# Patient Record
Sex: Male | Born: 1960 | Race: White | Hispanic: No | Marital: Married | Smoking: Never smoker
Health system: Southern US, Community
[De-identification: ages and names within clinical notes are randomized; demographics above are authoritative.]

## PROBLEM LIST (undated history)

## (undated) DIAGNOSIS — E291 Testicular hypofunction: Secondary | ICD-10-CM

## (undated) DIAGNOSIS — M1990 Unspecified osteoarthritis, unspecified site: Secondary | ICD-10-CM

## (undated) DIAGNOSIS — F419 Anxiety disorder, unspecified: Secondary | ICD-10-CM

## (undated) DIAGNOSIS — J302 Other seasonal allergic rhinitis: Secondary | ICD-10-CM

## (undated) DIAGNOSIS — E785 Hyperlipidemia, unspecified: Secondary | ICD-10-CM

## (undated) DIAGNOSIS — E2740 Unspecified adrenocortical insufficiency: Secondary | ICD-10-CM

## (undated) DIAGNOSIS — D582 Other hemoglobinopathies: Secondary | ICD-10-CM

## (undated) DIAGNOSIS — N529 Male erectile dysfunction, unspecified: Secondary | ICD-10-CM

## (undated) DIAGNOSIS — I10 Essential (primary) hypertension: Secondary | ICD-10-CM

## (undated) DIAGNOSIS — F988 Other specified behavioral and emotional disorders with onset usually occurring in childhood and adolescence: Secondary | ICD-10-CM

## (undated) DIAGNOSIS — B1910 Unspecified viral hepatitis B without hepatic coma: Secondary | ICD-10-CM

## (undated) HISTORY — PX: KNEE ARTHROSCOPY W/ MENISCAL REPAIR: SHX1877

---
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
## 2013-08-20 DIAGNOSIS — E785 Hyperlipidemia, unspecified: Secondary | ICD-10-CM

## 2013-08-20 DIAGNOSIS — N529 Male erectile dysfunction, unspecified: Secondary | ICD-10-CM

## 2013-08-20 DIAGNOSIS — F988 Other specified behavioral and emotional disorders with onset usually occurring in childhood and adolescence: Secondary | ICD-10-CM

## 2013-08-20 DIAGNOSIS — E291 Testicular hypofunction: Secondary | ICD-10-CM

## 2013-08-20 DIAGNOSIS — E2740 Unspecified adrenocortical insufficiency: Secondary | ICD-10-CM

## 2013-08-20 HISTORY — DX: Unspecified adrenocortical insufficiency: E27.40

## 2013-08-20 HISTORY — DX: Male erectile dysfunction, unspecified: N52.9

## 2013-08-20 HISTORY — DX: Testicular hypofunction: E29.1

## 2013-08-20 HISTORY — DX: Hyperlipidemia, unspecified: E78.5

## 2013-08-20 HISTORY — DX: Other specified behavioral and emotional disorders with onset usually occurring in childhood and adolescence: F98.8

## 2014-01-25 ENCOUNTER — Encounter (HOSPITAL_COMMUNITY)
Admission: RE | Admit: 2014-01-25 | Discharge: 2014-01-25 | Disposition: A | Discharge status: Home / Self Care | Payer: Self-pay | Source: Ambulatory Visit | Attending: *Deleted | Admitting: *Deleted

## 2014-01-25 ENCOUNTER — Encounter (HOSPITAL_COMMUNITY): Payer: Self-pay

## 2014-01-29 ENCOUNTER — Encounter (HOSPITAL_COMMUNITY): Payer: BC Managed Care – PPO

## 2014-01-29 ENCOUNTER — Encounter (HOSPITAL_COMMUNITY): Admission: RE | Admit: 2014-01-29 | Payer: BC Managed Care – PPO | Source: Ambulatory Visit

## 2014-02-01 ENCOUNTER — Encounter (HOSPITAL_COMMUNITY): Admission: RE | Admit: 2014-02-01 | Payer: BC Managed Care – PPO | Source: Ambulatory Visit

## 2014-02-03 ENCOUNTER — Encounter (HOSPITAL_COMMUNITY)
Admission: RE | Admit: 2014-02-03 | Discharge: 2014-02-03 | Disposition: A | Discharge status: Home / Self Care | Payer: BC Managed Care – PPO | Source: Ambulatory Visit | Attending: *Deleted | Admitting: *Deleted

## 2014-02-03 ENCOUNTER — Encounter (HOSPITAL_COMMUNITY): Payer: Self-pay

## 2014-02-03 DIAGNOSIS — D45 Polycythemia vera: Secondary | ICD-10-CM | POA: Insufficient documentation

## 2014-02-03 HISTORY — DX: Hyperlipidemia, unspecified: E78.5

## 2014-02-03 HISTORY — DX: Unspecified adrenocortical insufficiency: E27.40

## 2014-02-03 HISTORY — DX: Testicular hypofunction: E29.1

## 2014-02-03 HISTORY — DX: Other specified behavioral and emotional disorders with onset usually occurring in childhood and adolescence: F98.8

## 2014-02-03 HISTORY — DX: Male erectile dysfunction, unspecified: N52.9

## 2014-02-03 LAB — CBC
HCT: 55.9 % — ABNORMAL HIGH (ref 39.0–52.0)
Hemoglobin: 19.9 g/dL — ABNORMAL HIGH (ref 13.0–17.0)
MCH: 34 pg (ref 26.0–34.0)
MCHC: 35.6 g/dL (ref 30.0–36.0)
MCV: 95.6 fL (ref 78.0–100.0)
Platelets: 196 10*3/uL (ref 150–400)
RBC: 5.85 MIL/uL — ABNORMAL HIGH (ref 4.22–5.81)
RDW: 12.8 % (ref 11.5–15.5)
WBC: 8 10*3/uL (ref 4.0–10.5)

## 2014-02-03 NOTE — Discharge Instructions (Signed)
Polycythemia Vera  Polycythemia Jeffrey Chapman is a condition in which the body makes too many red blood cells and there is no known cause. The red blood cells (erythrocytes) are the cells which carry the oxygen in your blood stream to the cells of your body. Because of the increased red blood cells, the blood becomes thicker and does not circulate as well. It would be similar to your car having oil which is too thick so it cannot start and circulate as well. When the blood is too thick it often causes headaches and dizziness. It may also cause blood clots. Even though the blood clots easier, these patients bleed easier. The bleeding is caused because the blood cells which help stop bleeding (platelets) do not function normally. It occurs in all age groups but is more common in the 72 to 63 year age range. TREATMENT  The treatment of polycythemia vera for many years has been blood removal (phlebotomy) which is similar to blood removal in a blood bank, however this blood is not used for donation. Hydroxyurea is used to supplement phlebotomy. Aspirin is commonly given to thin the blood as long as the patient does not have a problem with bleeding. Other drugs are used based on the progression of the disease. Document Released: 05/01/2001 Document Revised: 10/29/2011 Document Reviewed: 11/05/2008 [facility] Patient Information 2015 [place], [place]. This information is not intended to replace advice given to you by your health care provider. Make sure you discuss any questions you have with your health care provider.  Therapeutic Phlebotomy Therapeutic phlebotomy is the controlled removal of blood from your body for the purpose of treating a medical condition. It is similar to donating blood. Usually, about a pint (470 mL) of blood is removed. The average adult has 9 to 12 pints (4.3 to 5.7 L) of blood. Therapeutic phlebotomy may be used to treat the following medical conditions:  Hemochromatosis. This is a condition in  which there is too much iron in the blood.  Polycythemia vera. This is a condition in which there are too many red cells in the blood.  Porphyria cutanea tarda. This is a disease usually passed from one generation to the next (inherited). It is a condition in which an important part of hemoglobin is not made properly. This results in the build up of abnormal amounts of porphyrins in the body.  Sickle cell disease. This is an inherited disease. It is a condition in which the red blood cells form an abnormal crescent shape rather than a round shape. LET YOUR CAREGIVER KNOW ABOUT:  Allergies.  Medicines taken including herbs, eyedrops, over-the-counter medicines, and creams.  Use of steroids (by mouth or creams).  Previous problems with anesthetics or numbing medicine.  History of blood clots.  History of bleeding or blood problems.  Previous surgery.  Possibility of pregnancy, if this applies. RISKS AND COMPLICATIONS This is a simple and safe procedure. Problems are unlikely. However, problems can occur and may include:  Nausea or lightheadedness.  Low blood pressure.  Soreness, bleeding, swelling, or bruising at the needle insertion site.  Infection. BEFORE THE PROCEDURE  This is a procedure that can be done as an outpatient. Confirm the time that you need to arrive for your procedure. Confirm whether there is a need to fast or withhold any medications. It is helpful to wear clothing with sleeves that can be raised above the elbow. A blood sample may be done to determine the amount of red blood cells or iron in  your blood. Plan ahead of time to have someone drive you home after the procedure. PROCEDURE The entire procedure from preparation through recovery takes about 1 hour. The actual collection takes about 10 to 15 minutes.  A needle will be inserted into your vein.  Tubing and a collection bag will be attached to that needle.  Blood will flow through the needle and  tubing into the collection bag.  You may be asked to open and close your hand slowly and continuously during the entire collection.  Once the specified amount of blood has been removed from your body, the collection bag and tubing will be clamped.  The needle will be removed.  Pressure will be held on the site of the needle insertion to stop the bleeding. Then a bandage will be placed over the needle insertion site. AFTER THE PROCEDURE  Your recovery will be assessed and monitored. If there are no problems, as an outpatient, you should be able to go home shortly after the procedure.  Document Released: 01/08/2011 Document Revised: 10/29/2011 Document Reviewed: 01/08/2011 [facility] Patient Information 2014 [place], [place].

## 2014-02-03 NOTE — Progress Notes (Signed)
Marinus Maw. presents today for phlebotomy per MD orders. HGB/HCTResults for DEANDREA, RION (MRN [id number]) as of 02/03/2014 10:39  Ref. Range 02/03/2014 09:30  WBC Latest Range: 4.0-10.5 K/uL 8.0  RBC Latest Range: 4.22-5.81 MIL/uL 5.85 (H)  Hemoglobin Latest Range: 13.0-17.0 g/dL 19.9 (H)  HCT Latest Range: 39.0-52.0 % 55.9 (H)  MCV Latest Range: 78.0-100.0 fL 95.6  MCH Latest Range: 26.0-34.0 pg 34.0  MCHC Latest Range: 30.0-36.0 g/dL 35.6  RDW Latest Range: 11.5-15.5 % 12.8  Platelets Latest Range: 150-400 K/uL 196   Phlebotomy procedure started at 1001 and ended at 1013. One unit removed. Patient tolerated procedure well. IV needle removed intact.  BP pre procedure 184/110;HR84: resp 18 Sat 97% Post procedure 162/111; HR 81 Sar 98% Will return next week for post H/H

## 2014-02-10 ENCOUNTER — Encounter (HOSPITAL_COMMUNITY)
Admission: RE | Admit: 2014-02-10 | Discharge: 2014-02-10 | Disposition: A | Discharge status: Home / Self Care | Payer: BC Managed Care – PPO | Source: Ambulatory Visit | Attending: *Deleted | Admitting: *Deleted

## 2014-02-10 ENCOUNTER — Encounter (HOSPITAL_COMMUNITY): Payer: Self-pay

## 2014-02-10 LAB — CBC
HCT: 52.5 % — ABNORMAL HIGH (ref 39.0–52.0)
Hemoglobin: 18.5 g/dL — ABNORMAL HIGH (ref 13.0–17.0)
MCH: 33.6 pg (ref 26.0–34.0)
MCHC: 35.2 g/dL (ref 30.0–36.0)
MCV: 95.5 fL (ref 78.0–100.0)
PLATELETS: 183 10*3/uL (ref 150–400)
RBC: 5.5 MIL/uL (ref 4.22–5.81)
RDW: 12.8 % (ref 11.5–15.5)
WBC: 7.2 10*3/uL (ref 4.0–10.5)

## 2014-02-10 NOTE — Progress Notes (Signed)
Results for Jeffrey Chapman, Jeffrey Chapman (MRN [id number]) as of 02/10/2014 10:25  1 week post therapeutic phebotomy  Lab work.   Ref. Range 02/10/2014 09:32  WBC Latest Range: 4.0-10.5 K/uL 7.2  RBC Latest Range: 4.22-5.81 MIL/uL 5.50  Hemoglobin Latest Range: 13.0-17.0 g/dL 18.5 (H)  HCT Latest Range: 39.0-52.0 % 52.5 (H)  MCV Latest Range: 78.0-100.0 fL 95.5  MCH Latest Range: 26.0-34.0 pg 33.6  MCHC Latest Range: 30.0-36.0 g/dL 35.2  RDW Latest Range: 11.5-15.5 % 12.8  Platelets Latest Range: 150-400 K/uL 183

## 2014-02-15 ENCOUNTER — Encounter (HOSPITAL_COMMUNITY): Payer: BC Managed Care – PPO

## 2014-02-16 ENCOUNTER — Encounter (HOSPITAL_COMMUNITY)
Admission: RE | Admit: 2014-02-16 | Discharge: 2014-02-16 | Disposition: A | Discharge status: Home / Self Care | Payer: BC Managed Care – PPO | Source: Ambulatory Visit | Attending: *Deleted | Admitting: *Deleted

## 2014-02-16 LAB — CBC WITH DIFFERENTIAL/PLATELET
BASOS ABS: 0 10*3/uL (ref 0.0–0.1)
Basophils Relative: 1 % (ref 0–1)
Eosinophils Absolute: 0.4 10*3/uL (ref 0.0–0.7)
Eosinophils Relative: 6 % — ABNORMAL HIGH (ref 0–5)
HCT: 51.5 % (ref 39.0–52.0)
Hemoglobin: 18.3 g/dL — ABNORMAL HIGH (ref 13.0–17.0)
Lymphocytes Relative: 31 % (ref 12–46)
Lymphs Abs: 2.3 10*3/uL (ref 0.7–4.0)
MCH: 33.8 pg (ref 26.0–34.0)
MCHC: 35.5 g/dL (ref 30.0–36.0)
MCV: 95.2 fL (ref 78.0–100.0)
Monocytes Absolute: 0.8 10*3/uL (ref 0.1–1.0)
Monocytes Relative: 11 % (ref 3–12)
NEUTROS ABS: 3.9 10*3/uL (ref 1.7–7.7)
Neutrophils Relative %: 51 % (ref 43–77)
Platelets: 215 10*3/uL (ref 150–400)
RBC: 5.41 MIL/uL (ref 4.22–5.81)
RDW: 13 % (ref 11.5–15.5)
WBC: 7.5 10*3/uL (ref 4.0–10.5)

## 2014-02-16 NOTE — Progress Notes (Signed)
Results for Jeffrey Chapman, Jeffrey Chapman (MRN [id number]) as of 02/16/2014 13:48  Ref. Range 02/16/2014 08:40  WBC Latest Range: 4.0-10.5 K/uL 7.5  RBC Latest Range: 4.22-5.81 MIL/uL 5.41  Hemoglobin Latest Range: 13.0-17.0 g/dL 18.3 (H)  HCT Latest Range: 39.0-52.0 % 51.5  MCV Latest Range: 78.0-100.0 fL 95.2  MCH Latest Range: 26.0-34.0 pg 33.8  MCHC Latest Range: 30.0-36.0 g/dL 35.5  RDW Latest Range: 11.5-15.5 % 13.0  Platelets Latest Range: 150-400 K/uL 215  Neutrophils Relative % Latest Range: 43-77 % 51  Lymphocytes Relative Latest Range: 12-46 % 31  Monocytes Relative Latest Range: 3-12 % 11  Eosinophils Relative Latest Range: 0-5 % 6 (H)  Basophils Relative Latest Range: 0-1 % 1  NEUT# Latest Range: 1.7-7.7 K/uL 3.9  Lymphocytes Absolute Latest Range: 0.7-4.0 K/uL 2.3  Monocytes Absolute Latest Range: 0.1-1.0 K/uL 0.8  Eosinophils Absolute Latest Range: 0.0-0.7 K/uL 0.4  Basophils Absolute Latest Range: 0.0-0.1 K/uL 0.0

## 2014-02-16 NOTE — Progress Notes (Signed)
Jeffrey Chapman. presents today for phlebotomy per MD orders. HGB/HCT:18.3/51.5 Phlebotomy procedure started at 0917 and ended at 0925. 1 unit removed. Patient tolerated procedure well. IV needle removed intact. Will return for follow up CBC in 1 week.

## 2014-02-23 ENCOUNTER — Encounter (HOSPITAL_COMMUNITY)
Admission: RE | Admit: 2014-02-23 | Discharge: 2014-02-23 | Disposition: A | Discharge status: Home / Self Care | Payer: BC Managed Care – PPO | Source: Ambulatory Visit | Attending: *Deleted | Admitting: *Deleted

## 2014-02-23 DIAGNOSIS — D45 Polycythemia vera: Secondary | ICD-10-CM | POA: Insufficient documentation

## 2014-02-23 LAB — CBC
HCT: 51 % (ref 39.0–52.0)
Hemoglobin: 18 g/dL — ABNORMAL HIGH (ref 13.0–17.0)
MCH: 33.5 pg (ref 26.0–34.0)
MCHC: 35.3 g/dL (ref 30.0–36.0)
MCV: 95 fL (ref 78.0–100.0)
Platelets: 195 10*3/uL (ref 150–400)
RBC: 5.37 MIL/uL (ref 4.22–5.81)
RDW: 12.9 % (ref 11.5–15.5)
WBC: 7.3 10*3/uL (ref 4.0–10.5)

## 2014-02-23 NOTE — Progress Notes (Signed)
Cbc results to Dr Octavio Graves

## 2014-03-17 ENCOUNTER — Encounter (HOSPITAL_COMMUNITY): Payer: BC Managed Care – PPO

## 2014-03-17 ENCOUNTER — Encounter (HOSPITAL_COMMUNITY)
Admission: RE | Admit: 2014-03-17 | Discharge: 2014-03-17 | Disposition: A | Discharge status: Home / Self Care | Payer: BC Managed Care – PPO | Source: Ambulatory Visit | Attending: *Deleted | Admitting: *Deleted

## 2016-11-15 ENCOUNTER — Encounter (HOSPITAL_COMMUNITY)
Admission: RE | Admit: 2016-11-15 | Discharge: 2016-11-15 | Disposition: A | Discharge status: Home / Self Care | Payer: BLUE CROSS/BLUE SHIELD | Source: Ambulatory Visit | Attending: *Deleted | Admitting: *Deleted

## 2016-11-15 DIAGNOSIS — D473 Essential (hemorrhagic) thrombocythemia: Secondary | ICD-10-CM | POA: Diagnosis present

## 2016-11-15 NOTE — Progress Notes (Signed)
Jeffrey Chapman. presents today for phlebotomy per MD orders. HGB/HCT:19.2/55.1 Phlebotomy procedure started at 1027 and ended at 1040. 500 cc removed. Patient tolerated procedure well. IV needle removed intact from R AC.

## 2016-11-28 ENCOUNTER — Institutional Professional Consult (permissible substitution): Payer: Self-pay | Admitting: Pulmonary Disease

## 2017-02-22 ENCOUNTER — Institutional Professional Consult (permissible substitution): Payer: Self-pay | Admitting: Pulmonary Disease

## 2018-04-01 ENCOUNTER — Encounter (HOSPITAL_COMMUNITY)
Admission: RE | Admit: 2018-04-01 | Discharge: 2018-04-01 | Disposition: A | Discharge status: Home / Self Care | Payer: BLUE CROSS/BLUE SHIELD | Source: Ambulatory Visit | Attending: *Deleted | Admitting: *Deleted

## 2018-04-01 DIAGNOSIS — D473 Essential (hemorrhagic) thrombocythemia: Secondary | ICD-10-CM | POA: Diagnosis not present

## 2018-04-01 NOTE — Progress Notes (Signed)
Jeffrey Chapman. presents today for phlebotomy per MD orders. HGB/HCT:20.1/58.7 Phlebotomy procedure started at 1025 and ended at 1033. 500 cc removed. Patient tolerated procedure well. IV needle removed intact.

## 2019-04-23 DIAGNOSIS — M1712 Unilateral primary osteoarthritis, left knee: Secondary | ICD-10-CM | POA: Diagnosis not present

## 2019-04-23 DIAGNOSIS — M1711 Unilateral primary osteoarthritis, right knee: Secondary | ICD-10-CM | POA: Diagnosis not present

## 2019-05-05 ENCOUNTER — Other Ambulatory Visit: Payer: Self-pay | Admitting: Orthopedic Surgery

## 2019-05-22 NOTE — Patient Instructions (Signed)
DUE TO COVID-19 ONLY ONE VISITOR IS ALLOWED TO COME WITH YOU AND STAY IN THE WAITING ROOM ONLY DURING PRE OP AND PROCEDURE DAY OF SURGERY. THE 1 VISITOR MAY VISIT WITH YOU AFTER SURGERY IN YOUR PRIVATE ROOM DURING VISITING HOURS ONLY!  YOU NEED TO HAVE A COVID 19 TEST ON_10/08/2020______ @_______ , THIS TEST MUST BE DONE BEFORE SURGERY, COME  [address] [place] , [id number].  ([place]) ONCE YOUR COVID TEST IS COMPLETED, PLEASE BEGIN THE QUARANTINE INSTRUCTIONS AS OUTLINED IN YOUR HANDOUT.                Jeffrey Chapman.    Your procedure is scheduled on: 06/01/2019   Report to [facility] Main  Entrance   Report to admitting at 11:55am     Call this number if you have problems the morning of surgery [phone]    Remember: Do not eat food After Midnight.  NO SOLID FOOD AFTER MIDNIGHT THE NIGHT PRIOR TO SURGERY. NOTHING BY MOUTH EXCEPT CLEAR LIQUIDS UNTIL 11:25am . PLEASE FINISH ENSURE DRINK PER SURGEON ORDER  WHICH NEEDS TO BE COMPLETED AT 11:25am.   CLEAR LIQUID DIET   Foods Allowed                                                                     Foods Excluded  Coffee and tea, regular and decaf                             liquids that you cannot  Plain Jell-O any favor except red or purple                                           see through such as: Fruit ices (not with fruit pulp)                                     milk, soups, orange juice  Iced Popsicles                                    All solid food Carbonated beverages, regular and diet                                    Cranberry, grape and apple juices Sports drinks like Gatorade Lightly seasoned clear broth or consume(fat free) Sugar, honey syrup  Sample Menu Breakfast                                Lunch                                     Supper Cranberry juice  Beef broth                            Chicken broth Jell-O                                      Grape juice                           Apple juice Coffee or tea                        Jell-O                                      Popsicle                                                Coffee or tea                        Coffee or tea  _____________________________________________________________________      BRUSH YOUR TEETH MORNING OF SURGERY AND RINSE YOUR MOUTH OUT, NO CHEWING GUM CANDY OR MINTS.     Take these medicines the morning of surgery with A SIP OF WATER: Allegra                                You may not have any metal on your body including hair pins and              piercings  Do not wear jewelry, make-up, lotions, powders or perfumes, deodorant                         Men may shave face and neck.   Do not bring valuables to the hospital. Jeffrey Chapman.  Contacts, dentures or bridgework may not be worn into surgery.  Leave suitcase in the car. After surgery it may be brought to your room.                  Please read over the following fact sheets you were given: _____________________________________________________________________             [facility] - Preparing for Surgery Before surgery, you can play an important role.  Because skin is not sterile, your skin needs to be as free of germs as possible.  You can reduce the number of germs on your skin by washing with CHG (chlorahexidine gluconate) soap before surgery.  CHG is an antiseptic cleaner which kills germs and bonds with the skin to continue killing germs even after washing. Please DO NOT use if you have an allergy to CHG or antibacterial soaps.  If your skin becomes reddened/irritated stop using the CHG and inform your nurse when you arrive at Short Stay. Do not shave (including legs and underarms) for at least 48 hours prior to the first CHG shower.  You may shave your face/neck. Please follow these instructions carefully:  1.  Shower with CHG  Soap the night before surgery and the  morning of Surgery.  2.  If you choose to wash your hair, wash your hair first as usual with your  normal  shampoo.  3.  After you shampoo, rinse your hair and body thoroughly to remove the  shampoo.                           4.  Use CHG as you would any other liquid soap.  You can apply chg directly  to the skin and wash                       Gently with a scrungie or clean washcloth.  5.  Apply the CHG Soap to your body ONLY FROM THE NECK DOWN.   Do not use on face/ open                           Wound or open sores. Avoid contact with eyes, ears mouth and genitals (private parts).                       Wash face,  Genitals (private parts) with your normal soap.             6.  Wash thoroughly, paying special attention to the area where your surgery  will be performed.  7.  Thoroughly rinse your body with warm water from the neck down.  8.  DO NOT shower/wash with your normal soap after using and rinsing off  the CHG Soap.                9.  Pat yourself dry with a clean towel.            10.  Wear clean pajamas.            11.  Place clean sheets on your bed the night of your first shower and do not  sleep with pets. Day of Surgery : Do not apply any lotions/deodorants the morning of surgery.  Please wear clean clothes to the hospital/surgery center.  FAILURE TO FOLLOW THESE INSTRUCTIONS MAY RESULT IN THE CANCELLATION OF YOUR SURGERY PATIENT SIGNATURE_________________________________  NURSE SIGNATURE__________________________________  ________________________________________________________________________   Jeffrey Chapman  An incentive spirometer is a tool that can help keep your lungs clear and active. This tool measures how well you are filling your lungs with each breath. Taking long deep breaths may help reverse or decrease the chance of developing breathing (pulmonary) problems (especially infection) following:  A long period of time  when you are unable to move or be active. BEFORE THE PROCEDURE   If the spirometer includes an indicator to show your best effort, your nurse or respiratory therapist will set it to a desired goal.  If possible, sit up straight or lean slightly forward. Try not to slouch.  Hold the incentive spirometer in an upright position. INSTRUCTIONS FOR USE  1. Sit on the edge of your bed if possible, or sit up as far as you can in bed or on a chair. 2. Hold the incentive spirometer in an upright position. 3. Breathe out normally. 4. Place the mouthpiece in your mouth and seal your lips tightly around it. 5. Breathe in slowly and as deeply as possible,  raising the piston or the ball toward the top of the column. 6. Hold your breath for 3-5 seconds or for as long as possible. Allow the piston or ball to fall to the bottom of the column. 7. Remove the mouthpiece from your mouth and breathe out normally. 8. Rest for a few seconds and repeat Steps 1 through 7 at least 10 times every 1-2 hours when you are awake. Take your time and take a few normal breaths between deep breaths. 9. The spirometer may include an indicator to show your best effort. Use the indicator as a goal to work toward during each repetition. 10. After each set of 10 deep breaths, practice coughing to be sure your lungs are clear. If you have an incision (the cut made at the time of surgery), support your incision when coughing by placing a pillow or rolled up towels firmly against it. Once you are able to get out of bed, walk around indoors and cough well. You may stop using the incentive spirometer when instructed by your caregiver.  RISKS AND COMPLICATIONS  Take your time so you do not get dizzy or light-headed.  If you are in pain, you may need to take or ask for pain medication before doing incentive spirometry. It is harder to take a deep breath if you are having pain. AFTER USE  Rest and breathe slowly and easily.  It can be  helpful to keep track of a log of your progress. Your caregiver can provide you with a simple table to help with this. If you are using the spirometer at home, follow these instructions: [place] IF:   You are having difficultly using the spirometer.  You have trouble using the spirometer as often as instructed.  Your pain medication is not giving enough relief while using the spirometer.  You develop fever of 100.5 F (38.1 C) or higher. SEEK IMMEDIATE MEDICAL CARE IF:   You cough up bloody sputum that had not been present before.  You develop fever of 102 F (38.9 C) or greater.  You develop worsening pain at or near the incision site. MAKE SURE YOU:   Understand these instructions.  Will watch your condition.  Will get help right away if you are not doing well or get worse. Document Released: 12/17/2006 Document Revised: 10/29/2011 Document Reviewed: 02/17/2007 [employer] Patient Information 2014 [place], [place].   ________________________________________________________________________  WHAT IS A BLOOD TRANSFUSION? Blood Transfusion Information  A transfusion is the replacement of blood or some of its parts. Blood is made up of multiple cells which provide different functions.  Red blood cells carry oxygen and are used for blood loss replacement.  White blood cells fight against infection.  Platelets control bleeding.  Plasma helps clot blood.  Other blood products are available for specialized needs, such as hemophilia or other clotting disorders. BEFORE THE TRANSFUSION  Who gives blood for transfusions?   Healthy volunteers who are fully evaluated to make sure their blood is safe. This is blood bank blood. Transfusion therapy is the safest it has ever been in the practice of medicine. Before blood is taken from a donor, a complete history is taken to make sure that person has no history of diseases nor engages in risky social behavior (examples are  intravenous drug use or sexual activity with multiple partners). The donor's travel history is screened to minimize risk of transmitting infections, such as malaria. The donated blood is tested for signs of infectious diseases, such as  HIV and hepatitis. The blood is then tested to be sure it is compatible with you in order to minimize the chance of a transfusion reaction. If you or a relative donates blood, this is often done in anticipation of surgery and is not appropriate for emergency situations. It takes many days to process the donated blood. RISKS AND COMPLICATIONS Although transfusion therapy is very safe and saves many lives, the main dangers of transfusion include:   Getting an infectious disease.  Developing a transfusion reaction. This is an allergic reaction to something in the blood you were given. Every precaution is taken to prevent this. The decision to have a blood transfusion has been considered carefully by your caregiver before blood is given. Blood is not given unless the benefits outweigh the risks. AFTER THE TRANSFUSION  Right after receiving a blood transfusion, you will usually feel much better and more energetic. This is especially true if your red blood cells have gotten low (anemic). The transfusion raises the level of the red blood cells which carry oxygen, and this usually causes an energy increase.  The nurse administering the transfusion will monitor you carefully for complications. HOME CARE INSTRUCTIONS  No special instructions are needed after a transfusion. You may find your energy is better. Speak with your caregiver about any limitations on activity for underlying diseases you may have. SEEK MEDICAL CARE IF:   Your condition is not improving after your transfusion.  You develop redness or irritation at the intravenous (IV) site. SEEK IMMEDIATE MEDICAL CARE IF:  Any of the following symptoms occur over the next 12 hours:  Shaking chills.  You have a  temperature by mouth above 102 F (38.9 C), not controlled by medicine.  Chest, back, or muscle pain.  People around you feel you are not acting correctly or are confused.  Shortness of breath or difficulty breathing.  Dizziness and fainting.  You get a rash or develop hives.  You have a decrease in urine output.  Your urine turns a dark color or changes to pink, red, or brown. Any of the following symptoms occur over the next 10 days:  You have a temperature by mouth above 102 F (38.9 C), not controlled by medicine.  Shortness of breath.  Weakness after normal activity.  The white part of the eye turns yellow (jaundice).  You have a decrease in the amount of urine or are urinating less often.  Your urine turns a dark color or changes to pink, red, or brown. Document Released: 08/03/2000 Document Revised: 10/29/2011 Document Reviewed: 03/22/2008 [facility] Patient Information 2014 [place], [place].  _______________________________________________________________________

## 2019-05-25 ENCOUNTER — Encounter (HOSPITAL_COMMUNITY)
Admission: RE | Admit: 2019-05-25 | Discharge: 2019-05-25 | Disposition: A | Discharge status: Home / Self Care | Payer: Medicare HMO | Source: Ambulatory Visit | Attending: *Deleted | Admitting: *Deleted

## 2019-05-27 ENCOUNTER — Encounter (HOSPITAL_COMMUNITY): Admission: RE | Admit: 2019-05-27 | Payer: BLUE CROSS/BLUE SHIELD | Source: Ambulatory Visit

## 2019-05-27 ENCOUNTER — Encounter (HOSPITAL_COMMUNITY): Payer: Self-pay

## 2019-05-27 ENCOUNTER — Other Ambulatory Visit: Payer: Self-pay | Admitting: Orthopedic Surgery

## 2019-05-27 NOTE — Patient Instructions (Signed)
DUE TO COVID-19 ONLY ONE VISITOR IS ALLOWED TO COME WITH YOU AND STAY IN THE WAITING ROOM ONLY DURING PRE OP AND PROCEDURE. THE ONE VISITOR MAY VISIT WITH YOU IN YOUR PRIVATE ROOM DURING VISITING HOURS ONLY!!   COVID SWAB TESTING MUST BE COMPLETED ON:  Thursday, Oct. 12 ,2020 at 9:25AM.   [address] [facility] enter pre surgical testing line (Must self quarantine after testing. Follow instructions on handout.)        Your procedure is scheduled on: Monday, Oct. 12, 2020   Report to [facility] Main  Entrance    Report to admitting at 9:15 AM   Call this number if you have problems the morning of surgery [phone]   Do not eat food:After Midnight.   May have liquids until 8:45 AM day of surgery   CLEAR LIQUID DIET  Foods Allowed                                                                     Foods Excluded  Water, Black Coffee and tea, regular and decaf                             liquids that you cannot  Plain Jell-O in any flavor  (No red)                                           see through such as: Fruit ices (not with fruit pulp)                                     milk, soups, orange juice  Iced Popsicles (No red)                                    All solid food Carbonated beverages, regular and diet                                    Apple juices Sports drinks like Gatorade (No red) Lightly seasoned clear broth or consume(fat free) Sugar, honey syrup  Sample Menu Breakfast                                Lunch                                     Supper Cranberry juice                    Beef broth                            Chicken broth Jell-O  Grape juice                           Apple juice Coffee or tea                        Jell-O                                      Popsicle                                                Coffee or tea                        Coffee or  tea   Complete one Ensure drink the morning of surgery at  8:45AM the day of surgery.   Brush your teeth the morning of surgery.   Do NOT smoke after Midnight   Take these medicines the morning of surgery with A SIP OF WATER: Fexofenadine                               You may not have any metal on your body including jewelry, and body piercings             Do not wear lotions, powders, perfumes/cologne, or deodorant                          Men may shave face and neck.   Do not bring valuables to the hospital. [place].   Contacts, dentures or bridgework may not be worn into surgery.   Bring small overnight bag day of surgery.    Special Instructions: Bring a copy of your healthcare power of attorney and living will documents         the day of surgery if you haven't scanned them in before.              Please read over the following fact sheets you were given:  [facility] - Preparing for Surgery Before surgery, you can play an important role.  Because skin is not sterile, your skin needs to be as free of germs as possible.  You can reduce the number of germs on your skin by washing with CHG (chlorahexidine gluconate) soap before surgery.  CHG is an antiseptic cleaner which kills germs and bonds with the skin to continue killing germs even after washing. Please DO NOT use if you have an allergy to CHG or antibacterial soaps.  If your skin becomes reddened/irritated stop using the CHG and inform your nurse when you arrive at Short Stay. Do not shave (including legs and underarms) for at least 48 hours prior to the first CHG shower.  You may shave your face/neck.  Please follow these instructions carefully:  1.  Shower with CHG Soap the night before surgery and the  morning of surgery.  2.  If you choose to wash your hair, wash your hair first as usual with your normal  shampoo.  3.  After you shampoo, rinse your hair and body  thoroughly to remove the shampoo.                             4.  Use CHG as you would any other liquid soap.  You can apply chg directly to the skin and wash.  Gently with a scrungie or clean washcloth.  5.  Apply the CHG Soap to your body ONLY FROM THE NECK DOWN.   Do   not use on face/ open                           Wound or open sores. Avoid contact with eyes, ears mouth and   genitals (private parts).                       Wash face,  Genitals (private parts) with your normal soap.             6.  Wash thoroughly, paying special attention to the area where your    surgery  will be performed.  7.  Thoroughly rinse your body with warm water from the neck down.  8.  DO NOT shower/wash with your normal soap after using and rinsing off the CHG Soap.                9.  Pat yourself dry with a clean towel.            10.  Wear clean pajamas.            11.  Place clean sheets on your bed the night of your first shower and do not  sleep with pets. Day of Surgery : Do not apply any lotions/deodorants the morning of surgery.  Please wear clean clothes to the hospital/surgery center.  FAILURE TO FOLLOW THESE INSTRUCTIONS MAY RESULT IN THE CANCELLATION OF YOUR SURGERY  PATIENT SIGNATURE_________________________________  NURSE SIGNATURE__________________________________  ________________________________________________________________________   Jeffrey Chapman  An incentive spirometer is a tool that can help keep your lungs clear and active. This tool measures how well you are filling your lungs with each breath. Taking long deep breaths may help reverse or decrease the chance of developing breathing (pulmonary) problems (especially infection) following:  A long period of time when you are unable to move or be active. BEFORE THE PROCEDURE   If the spirometer includes an indicator to show your best effort, your nurse or respiratory therapist will set it to a desired goal.  If possible,  sit up straight or lean slightly forward. Try not to slouch.  Hold the incentive spirometer in an upright position. INSTRUCTIONS FOR USE  1. Sit on the edge of your bed if possible, or sit up as far as you can in bed or on a chair. 2. Hold the incentive spirometer in an upright position. 3. Breathe out normally. 4. Place the mouthpiece in your mouth and seal your lips tightly around it. 5. Breathe in slowly and as deeply as possible, raising the piston or the ball toward the top of the column. 6. Hold your breath for 3-5 seconds or for as long as possible. Allow the piston or ball to fall to the bottom of the column. 7. Remove the mouthpiece from your mouth and breathe out normally. 8. Rest for a few seconds and repeat Steps 1 through 7 at  least 10 times every 1-2 hours when you are awake. Take your time and take a few normal breaths between deep breaths. 9. The spirometer may include an indicator to show your best effort. Use the indicator as a goal to work toward during each repetition. 10. After each set of 10 deep breaths, practice coughing to be sure your lungs are clear. If you have an incision (the cut made at the time of surgery), support your incision when coughing by placing a pillow or rolled up towels firmly against it. Once you are able to get out of bed, walk around indoors and cough well. You may stop using the incentive spirometer when instructed by your caregiver.  RISKS AND COMPLICATIONS  Take your time so you do not get dizzy or light-headed.  If you are in pain, you may need to take or ask for pain medication before doing incentive spirometry. It is harder to take a deep breath if you are having pain. AFTER USE  Rest and breathe slowly and easily.  It can be helpful to keep track of a log of your progress. Your caregiver can provide you with a simple table to help with this. If you are using the spirometer at home, follow these instructions: [place] IF:   You  are having difficultly using the spirometer.  You have trouble using the spirometer as often as instructed.  Your pain medication is not giving enough relief while using the spirometer.  You develop fever of 100.5 F (38.1 C) or higher. SEEK IMMEDIATE MEDICAL CARE IF:   You cough up bloody sputum that had not been present before.  You develop fever of 102 F (38.9 C) or greater.  You develop worsening pain at or near the incision site. MAKE SURE YOU:   Understand these instructions.  Will watch your condition.  Will get help right away if you are not doing well or get worse. Document Released: 12/17/2006 Document Revised: 10/29/2011 Document Reviewed: 02/17/2007 ExitCare Patient Information 2014 Exit[employer], [place].   ________________________________________________________________________  WHAT IS A BLOOD TRANSFUSION? Blood Transfusion Information  A transfusion is the replacement of blood or some of its parts. Blood is made up of multiple cells which provide different functions.  Red blood cells carry oxygen and are used for blood loss replacement.  White blood cells fight against infection.  Platelets control bleeding.  Plasma helps clot blood.  Other blood products are available for specialized needs, such as hemophilia or other clotting disorders. BEFORE THE TRANSFUSION  Who gives blood for transfusions?   Healthy volunteers who are fully evaluated to make sure their blood is safe. This is blood bank blood. Transfusion therapy is the safest it has ever been in the practice of medicine. Before blood is taken from a donor, a complete history is taken to make sure that person has no history of diseases nor engages in risky social behavior (examples are intravenous drug use or sexual activity with multiple partners). The donor's travel history is screened to minimize risk of transmitting infections, such as malaria. The donated blood is tested for signs of infectious  diseases, such as HIV and hepatitis. The blood is then tested to be sure it is compatible with you in order to minimize the chance of a transfusion reaction. If you or a relative donates blood, this is often done in anticipation of surgery and is not appropriate for emergency situations. It takes many days to process the donated blood. RISKS AND COMPLICATIONS Although transfusion  therapy is very safe and saves many lives, the main dangers of transfusion include:   Getting an infectious disease.  Developing a transfusion reaction. This is an allergic reaction to something in the blood you were given. Every precaution is taken to prevent this. The decision to have a blood transfusion has been considered carefully by your caregiver before blood is given. Blood is not given unless the benefits outweigh the risks. AFTER THE TRANSFUSION  Right after receiving a blood transfusion, you will usually feel much better and more energetic. This is especially true if your red blood cells have gotten low (anemic). The transfusion raises the level of the red blood cells which carry oxygen, and this usually causes an energy increase.  The nurse administering the transfusion will monitor you carefully for complications. HOME CARE INSTRUCTIONS  No special instructions are needed after a transfusion. You may find your energy is better. Speak with your caregiver about any limitations on activity for underlying diseases you may have. SEEK MEDICAL CARE IF:   Your condition is not improving after your transfusion.  You develop redness or irritation at the intravenous (IV) site. SEEK IMMEDIATE MEDICAL CARE IF:  Any of the following symptoms occur over the next 12 hours:  Shaking chills.  You have a temperature by mouth above 102 F (38.9 C), not controlled by medicine.  Chest, back, or muscle pain.  People around you feel you are not acting correctly or are confused.  Shortness of breath or difficulty  breathing.  Dizziness and fainting.  You get a rash or develop hives.  You have a decrease in urine output.  Your urine turns a dark color or changes to pink, red, or brown. Any of the following symptoms occur over the next 10 days:  You have a temperature by mouth above 102 F (38.9 C), not controlled by medicine.  Shortness of breath.  Weakness after normal activity.  The white part of the eye turns yellow (jaundice).  You have a decrease in the amount of urine or are urinating less often.  Your urine turns a dark color or changes to pink, red, or brown. Document Released: 08/03/2000 Document Revised: 10/29/2011 Document Reviewed: 03/22/2008 [facility] Patient Information 2014 [place], [place].  _______________________________________________________________________

## 2019-05-27 NOTE — Care Plan (Signed)
Spoke with patient prior to surgery. He plans to discharge to home with family. He is agreeable to minimal HHPT. Rolling walker ordered for home use. He will decide on OPPT after follow up appointment. MD and Patient aware of plan.  Choice offered.   Ladell Heads, [place]

## 2019-05-28 ENCOUNTER — Encounter (HOSPITAL_COMMUNITY)
Admission: RE | Admit: 2019-05-28 | Discharge: 2019-05-28 | Disposition: A | Discharge status: Home / Self Care | Payer: Medicare HMO | Source: Ambulatory Visit | Attending: Orthopedic Surgery | Admitting: Orthopedic Surgery

## 2019-05-28 ENCOUNTER — Other Ambulatory Visit (HOSPITAL_COMMUNITY)
Admission: RE | Admit: 2019-05-28 | Discharge: 2019-05-28 | Disposition: A | Discharge status: Home / Self Care | Payer: Medicare HMO | Source: Ambulatory Visit | Attending: Orthopedic Surgery | Admitting: Orthopedic Surgery

## 2019-05-28 ENCOUNTER — Encounter (HOSPITAL_COMMUNITY): Payer: Self-pay

## 2019-05-28 ENCOUNTER — Other Ambulatory Visit: Payer: Self-pay

## 2019-05-28 ENCOUNTER — Ambulatory Visit (HOSPITAL_COMMUNITY)
Admission: RE | Admit: 2019-05-28 | Discharge: 2019-05-28 | Disposition: A | Discharge status: Home / Self Care | Payer: Medicare HMO | Source: Ambulatory Visit | Attending: Orthopedic Surgery | Admitting: Orthopedic Surgery

## 2019-05-28 DIAGNOSIS — M1712 Unilateral primary osteoarthritis, left knee: Secondary | ICD-10-CM | POA: Diagnosis not present

## 2019-05-28 DIAGNOSIS — F419 Anxiety disorder, unspecified: Secondary | ICD-10-CM | POA: Insufficient documentation

## 2019-05-28 DIAGNOSIS — E785 Hyperlipidemia, unspecified: Secondary | ICD-10-CM | POA: Diagnosis not present

## 2019-05-28 DIAGNOSIS — I10 Essential (primary) hypertension: Secondary | ICD-10-CM | POA: Diagnosis not present

## 2019-05-28 DIAGNOSIS — F988 Other specified behavioral and emotional disorders with onset usually occurring in childhood and adolescence: Secondary | ICD-10-CM | POA: Insufficient documentation

## 2019-05-28 DIAGNOSIS — Z01818 Encounter for other preprocedural examination: Secondary | ICD-10-CM

## 2019-05-28 DIAGNOSIS — R69 Illness, unspecified: Secondary | ICD-10-CM | POA: Diagnosis not present

## 2019-05-28 DIAGNOSIS — Z20828 Contact with and (suspected) exposure to other viral communicable diseases: Secondary | ICD-10-CM | POA: Diagnosis not present

## 2019-05-28 DIAGNOSIS — Z01811 Encounter for preprocedural respiratory examination: Secondary | ICD-10-CM | POA: Diagnosis not present

## 2019-05-28 DIAGNOSIS — Z79899 Other long term (current) drug therapy: Secondary | ICD-10-CM | POA: Insufficient documentation

## 2019-05-28 HISTORY — DX: Anxiety disorder, unspecified: F41.9

## 2019-05-28 HISTORY — DX: Other hemoglobinopathies: D58.2

## 2019-05-28 HISTORY — DX: Unspecified osteoarthritis, unspecified site: M19.90

## 2019-05-28 HISTORY — DX: Essential (primary) hypertension: I10

## 2019-05-28 HISTORY — DX: Other seasonal allergic rhinitis: J30.2

## 2019-05-28 LAB — URINALYSIS, ROUTINE W REFLEX MICROSCOPIC
Bilirubin Urine: NEGATIVE
Glucose, UA: NEGATIVE mg/dL
Hgb urine dipstick: NEGATIVE
Ketones, ur: NEGATIVE mg/dL
Leukocytes,Ua: NEGATIVE
Nitrite: NEGATIVE
Protein, ur: NEGATIVE mg/dL
Specific Gravity, Urine: 1.006 (ref 1.005–1.030)
pH: 7 (ref 5.0–8.0)

## 2019-05-28 LAB — COMPREHENSIVE METABOLIC PANEL
ALT: 39 U/L (ref 0–44)
AST: 33 U/L (ref 15–41)
Albumin: 4.4 g/dL (ref 3.5–5.0)
Alkaline Phosphatase: 54 U/L (ref 38–126)
Anion gap: 11 (ref 5–15)
BUN: 21 mg/dL — ABNORMAL HIGH (ref 6–20)
CO2: 28 mmol/L (ref 22–32)
Calcium: 9.1 mg/dL (ref 8.9–10.3)
Chloride: 98 mmol/L (ref 98–111)
Creatinine, Ser: 1.3 mg/dL — ABNORMAL HIGH (ref 0.61–1.24)
GFR calc Af Amer: >60 mL/min (ref >60)
GFR calc non Af Amer: 59 mL/min — ABNORMAL LOW (ref >60)
Glucose, Bld: 93 mg/dL (ref 70–99)
Potassium: 4.7 mmol/L (ref 3.5–5.1)
Sodium: 137 mmol/L (ref 135–145)
Total Bilirubin: 1 mg/dL (ref 0.3–1.2)
Total Protein: 7.5 g/dL (ref 6.5–8.1)

## 2019-05-28 LAB — CBC WITH DIFFERENTIAL/PLATELET
Abs Immature Granulocytes: 0.08 10*3/uL — ABNORMAL HIGH (ref 0.00–0.07)
Basophils Absolute: 0.1 10*3/uL (ref 0.0–0.1)
Basophils Relative: 1 %
Eosinophils Absolute: 0.3 10*3/uL (ref 0.0–0.5)
Eosinophils Relative: 3 %
HCT: 59.6 % — ABNORMAL HIGH (ref 39.0–52.0)
Hemoglobin: 19.6 g/dL — ABNORMAL HIGH (ref 13.0–17.0)
Immature Granulocytes: 1 %
Lymphocytes Relative: 24 %
Lymphs Abs: 1.9 10*3/uL (ref 0.7–4.0)
MCH: 31.5 pg (ref 26.0–34.0)
MCHC: 32.9 g/dL (ref 30.0–36.0)
MCV: 95.7 fL (ref 80.0–100.0)
Monocytes Absolute: 0.9 10*3/uL (ref 0.1–1.0)
Monocytes Relative: 12 %
Neutro Abs: 4.7 10*3/uL (ref 1.7–7.7)
Neutrophils Relative %: 59 %
Platelets: 229 10*3/uL (ref 150–400)
RBC: 6.23 MIL/uL — ABNORMAL HIGH (ref 4.22–5.81)
RDW: 13.4 % (ref 11.5–15.5)
WBC: 7.9 10*3/uL (ref 4.0–10.5)
nRBC: 0 % (ref 0.0–0.2)

## 2019-05-28 LAB — SURGICAL PCR SCREEN
MRSA, PCR: NEGATIVE
Staphylococcus aureus: POSITIVE — AB

## 2019-05-28 LAB — APTT: aPTT: 37 seconds — ABNORMAL HIGH (ref 24–36)

## 2019-05-28 LAB — PROTIME-INR
INR: 1 (ref 0.8–1.2)
Prothrombin Time: 13.3 seconds (ref 11.4–15.2)

## 2019-05-28 LAB — ABO/RH: ABO/RH(D): O POS

## 2019-05-28 NOTE — Progress Notes (Signed)
PCP -  Dr. Octavio Graves Cardiologist - N/A  Chest x-ray - 05/28/2019 in epic EKG - 05/28/2019 in epic Stress Test - N/A ECHO - N/A Cardiac Cath - N/A  Sleep Study - N/A CPAP - N/A  Fasting Blood Sugar - N/A Checks Blood Sugar __N/A___ times a day  Blood Thinner Instructions:  N/A Aspirin Instructions:  N/A Last Dose:  N/A  Anesthesia review: N/A  Patient denies shortness of breath, fever, cough and chest pain at PAT appointment   Patient verbalized understanding of instructions that were given to them at the PAT appointment. Patient was also instructed that they will need to review over the PAT instructions again at home before surgery.

## 2019-05-28 NOTE — H&P (Signed)
TOTAL KNEE ADMISSION H&P  Patient is being admitted for left total knee arthroplasty.  Subjective:  Chief Complaint:left knee pain.  HPI: Jeffrey Chapman, 58 y.o. male, has a history of pain and functional disability in the left knee due to arthritis and has failed non-surgical conservative treatments for greater than 12 weeks to includeNSAID's and/or analgesics, corticosteriod injections, flexibility and strengthening excercises and activity modification.  Onset of symptoms was gradual, starting several years ago with gradually worsening course since that time. The patient noted prior procedures on the knee to include  arthroscopy on the left knee(s).  Patient currently rates pain in the left knee(s) at 10 out of 10 with activity. Patient has night pain, worsening of pain with activity and weight bearing, pain that interferes with activities of daily living, pain with passive range of motion, crepitus and joint swelling.  Patient has evidence of subchondral sclerosis, periarticular osteophytes and joint space narrowing by imaging studies.   There is no active infection.  There are no active problems to display for this patient.  Past Medical History:  Diagnosis Date  . ADD (attention deficit disorder) 2015  . Anxiety   . Arthritis   . ED (erectile dysfunction) 2015  . Hepatitis B 1977  . High blood hemoglobin A2 (HCC)   . Hyperlipemia 2015  . Hypertension   . Hypoaldosteronism ([place]) 2015  . Hypogonadism, testicular 2015  . Seasonal allergies     Past Surgical History:  Procedure Laterality Date  . KNEE ARTHROSCOPY W/ MENISCAL REPAIR Left     No current facility-administered medications for this encounter.    Current Outpatient Medications  Medication Sig Dispense Refill Last Dose  . dextroamphetamine (DEXEDRINE SPANSULE) 15 MG 24 hr capsule Take 15 mg by mouth daily.     . fexofenadine (ALLEGRA) 30 MG tablet Take 30 mg by mouth 2 (two) times daily.     Marland Kitchen lisinopril  (PRINIVIL,ZESTRIL) 20 MG tablet Take 20 mg by mouth 2 (two) times daily.      No Known Allergies  Social History   Tobacco Use  . Smoking status: Never Smoker  . Smokeless tobacco: Never Used  Substance Use Topics  . Alcohol use: Yes    Alcohol/week: 1.0 standard drinks    Types: 1 Shots of liquor per week    No family history on file.   Review of Systems  Constitutional: Negative.   HENT: Negative.   Eyes: Negative.   Respiratory: Negative.   Cardiovascular: Negative.   Gastrointestinal: Negative.   Genitourinary: Negative.   Musculoskeletal: Positive for joint pain.  Skin: Negative.   Neurological: Negative.   Endo/Heme/Allergies: Negative.   Psychiatric/Behavioral: Negative.     Objective:  Physical Exam  Constitutional: He is oriented to person, place, and time. He appears well-developed and well-nourished.  HENT:  Head: Normocephalic and atraumatic.  Eyes: Pupils are equal, round, and reactive to light.  Neck: Normal range of motion. Neck supple.  Cardiovascular: Intact distal pulses.  Respiratory: Effort normal.  Musculoskeletal:        General: Tenderness present.     Comments: patient has a range from 0-110 on the left and 0-115 on the right.  Obvious crepitance with range of motion.  Tenderness over the medial joint line bilaterally.  Calves are soft and nontender.  Patient's right leg does appear to have a gallon and cyst over the right medial hamstring tendon region.    Neurological: He is alert and oriented to person, place, and time.  Skin:  Skin is warm and dry.  Psychiatric: He has a normal mood and affect. His behavior is normal. Judgment and thought content normal.    Vital signs in last 24 hours: Temp:  [98 F (36.7 C)] 98 F (36.7 C) (10/08 0807) Pulse Rate:  [77] 77 (10/08 0807) Resp:  [16] 16 (10/08 0807) BP: (162)/(92) 162/92 (10/08 0807) SpO2:  [99 %] 99 % (10/08 0807) Weight:  [100.2 kg] 100.2 kg (10/08 0807)  Labs:   Estimated  body mass index is 32.64 kg/m as calculated from the following:   Height as of 05/28/19: 5\' 9"  (1.753 m).   Weight as of 05/28/19: 100.2 kg.   Imaging Review Plain radiographs demonstrate AP, lateral, sunrise, Lutricia Feil, he has moderately advanced tricompartmental osteoarthritis shown of both knees, with it being significantly more in the medial joint line with near bone-on-bone changes seen, as well as peripheral osteophytes and spurring noted in the patellofemoral compartment   Assessment/Plan:  End stage arthritis, left knee   The patient history, physical examination, clinical judgment of the provider and imaging studies are consistent with end stage degenerative joint disease of the left knee(s) and total knee arthroplasty is deemed medically necessary. The treatment options including medical management, injection therapy arthroscopy and arthroplasty were discussed at length. The risks and benefits of total knee arthroplasty were presented and reviewed. The risks due to aseptic loosening, infection, stiffness, patella tracking problems, thromboembolic complications and other imponderables were discussed. The patient acknowledged the explanation, agreed to proceed with the plan and consent was signed. Patient is being admitted for inpatient treatment for surgery, pain control, PT, OT, prophylactic antibiotics, VTE prophylaxis, progressive ambulation and ADL's and discharge planning. The patient is planning to be discharged home with home health services     Patient's anticipated LOS is less than 2 midnights, meeting these requirements: - Younger than 68 - Lives within 1 hour of care - Has a competent adult at home to recover with post-op recover - NO history of  - Chronic pain requiring opiods  - Diabetes  - Coronary Artery Disease  - Heart failure  - Heart attack  - Stroke  - DVT/VTE  - Cardiac arrhythmia  - Respiratory Failure/COPD  - Renal failure  - Anemia  - Advanced Liver  disease

## 2019-05-28 NOTE — Progress Notes (Signed)
SPOKE W/  Jeffrey Chapman     SCREENING SYMPTOMS OF COVID 19:   COUGH--NO  RUNNY NOSE--- NO  SORE THROAT---NO  NASAL CONGESTION----NO  SNEEZING----NO  SHORTNESS OF BREATH---NO  DIFFICULTY BREATHING---NO  TEMP >100.0 -----NO  UNEXPLAINED BODY ACHES------NO  CHILLS -------- NO  HEADACHES ---------NO  LOSS OF SMELL/ TASTE --------NO    HAVE YOU OR ANY FAMILY MEMBER TRAVELLED PAST 14 DAYS OUT OF THE   COUNTY---Lives in [facility] STATE----NO COUNTRY----NO  HAVE YOU OR ANY FAMILY MEMBER BEEN EXPOSED TO ANYONE WITH COVID 19? NO

## 2019-05-29 LAB — NOVEL CORONAVIRUS, NAA (HOSP ORDER, SEND-OUT TO REF LAB; TAT 18-24 HRS): SARS-CoV-2, NAA: NOT DETECTED

## 2019-05-31 MED ORDER — TRANEXAMIC ACID 1000 MG/10ML IV SOLN
2000.0000 mg | INTRAVENOUS | Status: AC
  Filled 2019-05-31: qty 20

## 2019-05-31 MED ORDER — BUPIVACAINE LIPOSOME 1.3 % IJ SUSP
20.0000 mL | Freq: Once | INTRAMUSCULAR | Status: DC
  Filled 2019-05-31: qty 20

## 2019-06-01 ENCOUNTER — Ambulatory Visit (HOSPITAL_COMMUNITY): Payer: Medicare HMO | Admitting: Physician Assistant

## 2019-06-01 ENCOUNTER — Ambulatory Visit (HOSPITAL_COMMUNITY): Payer: Medicare HMO | Admitting: Anesthesiology

## 2019-06-01 ENCOUNTER — Observation Stay (HOSPITAL_COMMUNITY)
Admission: AD | Admit: 2019-06-01 | Discharge: 2019-06-02 | Disposition: A | Discharge status: Home Health | Payer: Medicare HMO | Attending: Orthopedic Surgery | Admitting: Orthopedic Surgery

## 2019-06-01 ENCOUNTER — Encounter (HOSPITAL_COMMUNITY)
Admission: AD | Disposition: A | Discharge status: Home Health | Payer: Self-pay | Source: Home / Self Care | Attending: Orthopedic Surgery

## 2019-06-01 ENCOUNTER — Other Ambulatory Visit: Payer: Self-pay

## 2019-06-01 ENCOUNTER — Encounter (HOSPITAL_COMMUNITY): Payer: Self-pay | Admitting: Anesthesiology

## 2019-06-01 DIAGNOSIS — D62 Acute posthemorrhagic anemia: Secondary | ICD-10-CM | POA: Insufficient documentation

## 2019-06-01 DIAGNOSIS — Z791 Long term (current) use of non-steroidal anti-inflammatories (NSAID): Secondary | ICD-10-CM | POA: Insufficient documentation

## 2019-06-01 DIAGNOSIS — Z888 Allergy status to other drugs, medicaments and biological substances status: Secondary | ICD-10-CM | POA: Insufficient documentation

## 2019-06-01 DIAGNOSIS — M1712 Unilateral primary osteoarthritis, left knee: Secondary | ICD-10-CM

## 2019-06-01 DIAGNOSIS — R69 Illness, unspecified: Secondary | ICD-10-CM | POA: Diagnosis not present

## 2019-06-01 DIAGNOSIS — M170 Bilateral primary osteoarthritis of knee: Principal | ICD-10-CM | POA: Insufficient documentation

## 2019-06-01 DIAGNOSIS — I10 Essential (primary) hypertension: Secondary | ICD-10-CM | POA: Insufficient documentation

## 2019-06-01 DIAGNOSIS — Z7982 Long term (current) use of aspirin: Secondary | ICD-10-CM | POA: Insufficient documentation

## 2019-06-01 DIAGNOSIS — Z96651 Presence of right artificial knee joint: Secondary | ICD-10-CM

## 2019-06-01 DIAGNOSIS — Z79899 Other long term (current) drug therapy: Secondary | ICD-10-CM | POA: Diagnosis not present

## 2019-06-01 DIAGNOSIS — G8918 Other acute postprocedural pain: Secondary | ICD-10-CM | POA: Diagnosis not present

## 2019-06-01 DIAGNOSIS — M1711 Unilateral primary osteoarthritis, right knee: Secondary | ICD-10-CM | POA: Diagnosis not present

## 2019-06-01 HISTORY — PX: TOTAL KNEE ARTHROPLASTY: SHX125

## 2019-06-01 LAB — TYPE AND SCREEN
ABO/RH(D): O POS
Antibody Screen: NEGATIVE

## 2019-06-01 SURGERY — ARTHROPLASTY, KNEE, TOTAL
Anesthesia: Spinal | Site: Knee | Laterality: Right

## 2019-06-01 MED ORDER — MIDAZOLAM HCL 5 MG/5ML IJ SOLN
INTRAMUSCULAR | Status: DC | PRN
  Administered 2019-06-01: 0.5 mg via INTRAVENOUS
  Administered 2019-06-01: 1 mg via INTRAVENOUS

## 2019-06-01 MED ORDER — CHLORHEXIDINE GLUCONATE 4 % EX LIQD: 60.0000 mL | Freq: Once | CUTANEOUS | Status: DC

## 2019-06-01 MED ORDER — ASPIRIN 81 MG PO CHEW
81.0000 mg | CHEWABLE_TABLET | Freq: Two times a day (BID) | ORAL | Status: DC
  Administered 2019-06-01 – 2019-06-02 (×2): 81 mg via ORAL
  Filled 2019-06-01 (×2): qty 1

## 2019-06-01 MED ORDER — CLONIDINE HCL (ANALGESIA) 100 MCG/ML EP SOLN
EPIDURAL | Status: DC | PRN
  Administered 2019-06-01: 100 ug

## 2019-06-01 MED ORDER — CELECOXIB 200 MG PO CAPS
200.0000 mg | ORAL_CAPSULE | Freq: Two times a day (BID) | ORAL | Status: DC
  Administered 2019-06-01 – 2019-06-02 (×3): 200 mg via ORAL
  Filled 2019-06-01 (×3): qty 1

## 2019-06-01 MED ORDER — WATER FOR IRRIGATION, STERILE IR SOLN
Status: DC | PRN
  Administered 2019-06-01 (×2): 1000 mL

## 2019-06-01 MED ORDER — PHENOL 1.4 % MT LIQD
1.0000 | OROMUCOSAL | Status: DC | PRN
  Filled 2019-06-01: qty 177

## 2019-06-01 MED ORDER — LISINOPRIL 20 MG PO TABS: 20.0000 mg | ORAL_TABLET | Freq: Every day | ORAL | Status: DC

## 2019-06-01 MED ORDER — FENTANYL CITRATE (PF) 100 MCG/2ML IJ SOLN
50.0000 ug | INTRAMUSCULAR | Status: DC
  Administered 2019-06-01: 100 ug via INTRAVENOUS
  Filled 2019-06-01: qty 2

## 2019-06-01 MED ORDER — KCL IN DEXTROSE-NACL 20-5-0.45 MEQ/L-%-% IV SOLN
INTRAVENOUS | Status: DC
  Administered 2019-06-01 – 2019-06-02 (×2): via INTRAVENOUS
  Filled 2019-06-01 (×3): qty 1000

## 2019-06-01 MED ORDER — FLEET ENEMA 7-19 GM/118ML RE ENEM: 1.0000 | ENEMA | Freq: Once | RECTAL | Status: DC | PRN

## 2019-06-01 MED ORDER — MENTHOL 3 MG MT LOZG: 1.0000 | LOZENGE | OROMUCOSAL | Status: DC | PRN

## 2019-06-01 MED ORDER — DIPHENHYDRAMINE HCL 12.5 MG/5ML PO ELIX: 12.5000 mg | ORAL_SOLUTION | ORAL | Status: DC | PRN

## 2019-06-01 MED ORDER — ONDANSETRON HCL 4 MG PO TABS: 4.0000 mg | ORAL_TABLET | Freq: Four times a day (QID) | ORAL | Status: DC | PRN

## 2019-06-01 MED ORDER — BUPIVACAINE LIPOSOME 1.3 % IJ SUSP
INTRAMUSCULAR | Status: DC | PRN
  Administered 2019-06-01: 20 mL

## 2019-06-01 MED ORDER — KETAMINE HCL 10 MG/ML IJ SOLN
INTRAMUSCULAR | Status: DC | PRN
  Administered 2019-06-01: 20 mg via INTRAVENOUS

## 2019-06-01 MED ORDER — KETOROLAC TROMETHAMINE 30 MG/ML IJ SOLN: 30.0000 mg | Freq: Once | INTRAMUSCULAR | Status: DC | PRN

## 2019-06-01 MED ORDER — SODIUM CHLORIDE 0.9 % IR SOLN
Status: DC | PRN
  Administered 2019-06-01: 1000 mL

## 2019-06-01 MED ORDER — LACTATED RINGERS IV SOLN
INTRAVENOUS | Status: DC
  Administered 2019-06-01 (×2): via INTRAVENOUS

## 2019-06-01 MED ORDER — ASPIRIN EC 81 MG PO TBEC: 81.0000 mg | DELAYED_RELEASE_TABLET | Freq: Two times a day (BID) | ORAL | 0 refills | Status: AC

## 2019-06-01 MED ORDER — BUPIVACAINE IN DEXTROSE 0.75-8.25 % IT SOLN
INTRATHECAL | Status: DC | PRN
  Administered 2019-06-01: 14 mg via INTRATHECAL

## 2019-06-01 MED ORDER — METOCLOPRAMIDE HCL 5 MG PO TABS: 5.0000 mg | ORAL_TABLET | Freq: Three times a day (TID) | ORAL | Status: DC | PRN

## 2019-06-01 MED ORDER — PROMETHAZINE HCL 25 MG/ML IJ SOLN: 6.2500 mg | INTRAMUSCULAR | Status: DC | PRN

## 2019-06-01 MED ORDER — SODIUM CHLORIDE (PF) 0.9 % IJ SOLN
INTRAMUSCULAR | Status: AC
  Filled 2019-06-01: qty 50

## 2019-06-01 MED ORDER — METHOCARBAMOL 500 MG PO TABS
500.0000 mg | ORAL_TABLET | Freq: Four times a day (QID) | ORAL | Status: DC | PRN
  Filled 2019-06-01: qty 1

## 2019-06-01 MED ORDER — LORATADINE 10 MG PO TABS: 10.0000 mg | ORAL_TABLET | Freq: Every day | ORAL | Status: DC

## 2019-06-01 MED ORDER — HYDROMORPHONE HCL 1 MG/ML IJ SOLN: 0.2500 mg | INTRAMUSCULAR | Status: DC | PRN

## 2019-06-01 MED ORDER — GABAPENTIN 300 MG PO CAPS
300.0000 mg | ORAL_CAPSULE | Freq: Three times a day (TID) | ORAL | Status: DC
  Administered 2019-06-01 – 2019-06-02 (×3): 300 mg via ORAL
  Filled 2019-06-01 (×3): qty 1

## 2019-06-01 MED ORDER — TIZANIDINE HCL 2 MG PO TABS: 2.0000 mg | ORAL_TABLET | Freq: Four times a day (QID) | ORAL | 0 refills | Status: DC | PRN

## 2019-06-01 MED ORDER — PHENYLEPHRINE HCL (PRESSORS) 10 MG/ML IV SOLN
INTRAVENOUS | Status: DC | PRN
  Administered 2019-06-01: 60 ug via INTRAVENOUS

## 2019-06-01 MED ORDER — POVIDONE-IODINE 10 % EX SWAB
2.0000 "application " | Freq: Once | CUTANEOUS | Status: AC
  Administered 2019-06-01: 2 via TOPICAL

## 2019-06-01 MED ORDER — ALUM & MAG HYDROXIDE-SIMETH 200-200-20 MG/5ML PO SUSP: 30.0000 mL | ORAL | Status: DC | PRN

## 2019-06-01 MED ORDER — METOCLOPRAMIDE HCL 5 MG/ML IJ SOLN: 5.0000 mg | Freq: Three times a day (TID) | INTRAMUSCULAR | Status: DC | PRN

## 2019-06-01 MED ORDER — METHOCARBAMOL 500 MG IVPB - SIMPLE MED
500.0000 mg | Freq: Four times a day (QID) | INTRAVENOUS | Status: DC | PRN
  Filled 2019-06-01: qty 50

## 2019-06-01 MED ORDER — SODIUM CHLORIDE (PF) 0.9 % IJ SOLN
INTRAMUSCULAR | Status: AC
  Filled 2019-06-01: qty 10

## 2019-06-01 MED ORDER — ONDANSETRON HCL 4 MG/2ML IJ SOLN: 4.0000 mg | Freq: Four times a day (QID) | INTRAMUSCULAR | Status: DC | PRN

## 2019-06-01 MED ORDER — MIDAZOLAM HCL 2 MG/2ML IJ SOLN
1.0000 mg | INTRAMUSCULAR | Status: DC
  Administered 2019-06-01: 2 mg via INTRAVENOUS
  Filled 2019-06-01: qty 2

## 2019-06-01 MED ORDER — PANTOPRAZOLE SODIUM 40 MG PO TBEC
40.0000 mg | DELAYED_RELEASE_TABLET | Freq: Every day | ORAL | Status: DC
  Administered 2019-06-01: 40 mg via ORAL
  Filled 2019-06-01: qty 1

## 2019-06-01 MED ORDER — POLYETHYLENE GLYCOL 3350 17 G PO PACK: 17.0000 g | PACK | Freq: Every day | ORAL | Status: DC | PRN

## 2019-06-01 MED ORDER — TRANEXAMIC ACID-NACL 1000-0.7 MG/100ML-% IV SOLN
1000.0000 mg | INTRAVENOUS | Status: AC
  Administered 2019-06-01: 11:00:00 1000 mg via INTRAVENOUS
  Filled 2019-06-01: qty 100

## 2019-06-01 MED ORDER — BUPIVACAINE HCL (PF) 0.25 % IJ SOLN
INTRAMUSCULAR | Status: AC
  Filled 2019-06-01: qty 30

## 2019-06-01 MED ORDER — ONDANSETRON HCL 4 MG/2ML IJ SOLN
INTRAMUSCULAR | Status: DC | PRN
  Administered 2019-06-01: 4 mg via INTRAVENOUS

## 2019-06-01 MED ORDER — OXYCODONE HCL 5 MG PO TABS
5.0000 mg | ORAL_TABLET | ORAL | Status: DC | PRN
  Administered 2019-06-01 – 2019-06-02 (×6): 10 mg via ORAL
  Filled 2019-06-01 (×6): qty 2

## 2019-06-01 MED ORDER — BISACODYL 5 MG PO TBEC: 5.0000 mg | DELAYED_RELEASE_TABLET | Freq: Every day | ORAL | Status: DC | PRN

## 2019-06-01 MED ORDER — DEXTROAMPHETAMINE SULFATE ER 5 MG PO CP24: 15.0000 mg | ORAL_CAPSULE | Freq: Every day | ORAL | Status: DC

## 2019-06-01 MED ORDER — DOCUSATE SODIUM 100 MG PO CAPS
100.0000 mg | ORAL_CAPSULE | Freq: Two times a day (BID) | ORAL | Status: DC
  Administered 2019-06-01 – 2019-06-02 (×2): 100 mg via ORAL
  Filled 2019-06-01 (×2): qty 1

## 2019-06-01 MED ORDER — PROPOFOL 500 MG/50ML IV EMUL
INTRAVENOUS | Status: DC | PRN
  Administered 2019-06-01: 50 ug/kg/min via INTRAVENOUS

## 2019-06-01 MED ORDER — ACETAMINOPHEN 325 MG PO TABS: 325.0000 mg | ORAL_TABLET | Freq: Four times a day (QID) | ORAL | Status: DC | PRN

## 2019-06-01 MED ORDER — BUPIVACAINE HCL (PF) 0.25 % IJ SOLN
INTRAMUSCULAR | Status: DC | PRN
  Administered 2019-06-01: 30 mL

## 2019-06-01 MED ORDER — MEPERIDINE HCL 50 MG/ML IJ SOLN: 6.2500 mg | INTRAMUSCULAR | Status: DC | PRN

## 2019-06-01 MED ORDER — OXYCODONE-ACETAMINOPHEN 5-325 MG PO TABS: 1.0000 | ORAL_TABLET | ORAL | 0 refills | Status: DC | PRN

## 2019-06-01 MED ORDER — LISINOPRIL 20 MG PO TABS: 20.0000 mg | ORAL_TABLET | Freq: Two times a day (BID) | ORAL | Status: DC

## 2019-06-01 MED ORDER — TRANEXAMIC ACID-NACL 1000-0.7 MG/100ML-% IV SOLN
1000.0000 mg | Freq: Once | INTRAVENOUS | Status: AC
  Administered 2019-06-01: 1000 mg via INTRAVENOUS
  Filled 2019-06-01: qty 100

## 2019-06-01 MED ORDER — CEFAZOLIN SODIUM-DEXTROSE 2-4 GM/100ML-% IV SOLN
2.0000 g | INTRAVENOUS | Status: AC
  Administered 2019-06-01: 10:00:00 2 g via INTRAVENOUS
  Filled 2019-06-01: qty 100

## 2019-06-01 MED ORDER — ROPIVACAINE HCL 7.5 MG/ML IJ SOLN
INTRAMUSCULAR | Status: DC | PRN
  Administered 2019-06-01 (×4): 5 mL via PERINEURAL

## 2019-06-01 MED ORDER — HYDROMORPHONE HCL 1 MG/ML IJ SOLN: 0.5000 mg | INTRAMUSCULAR | Status: DC | PRN

## 2019-06-01 MED ORDER — LIDOCAINE HCL (CARDIAC) PF 100 MG/5ML IV SOSY
PREFILLED_SYRINGE | INTRAVENOUS | Status: DC | PRN
  Administered 2019-06-01: 30 mg via INTRAVENOUS

## 2019-06-01 MED ORDER — SODIUM CHLORIDE (PF) 0.9 % IJ SOLN
INTRAMUSCULAR | Status: DC | PRN
  Administered 2019-06-01: 60 mL

## 2019-06-01 MED ORDER — ROPIVACAINE HCL 5 MG/ML IJ SOLN
INTRAMUSCULAR | Status: DC | PRN
  Administered 2019-06-01 (×2): 5 mL via PERINEURAL

## 2019-06-01 SURGICAL SUPPLY — 50 items
ATTUNE MED DOME PAT 38 KNEE (Knees) ×2 IMPLANT
ATTUNE MED DOME PAT 38MM KNEE (Knees) ×1 IMPLANT
ATTUNE PS FEM RT SZ 6 CEM KNEE (Femur) ×3 IMPLANT
ATTUNE PSRP INSR SZ6 5 KNEE (Insert) ×2 IMPLANT
ATTUNE PSRP INSR SZ6 5MM KNEE (Insert) ×1 IMPLANT
BAG DECANTER FOR FLEXI CONT (MISCELLANEOUS) ×3 IMPLANT
BAG ZIPLOCK 12X15 (MISCELLANEOUS) ×3 IMPLANT
BASE TIBIAL ROT PLAT SZ 7 KNEE (Knees) ×1 IMPLANT
BLADE SAG 18X100X1.27 (BLADE) ×3 IMPLANT
BLADE SAW SGTL 11.0X1.19X90.0M (BLADE) ×3 IMPLANT
BLADE SURG SZ10 CARB STEEL (BLADE) ×6 IMPLANT
BNDG ELASTIC 6X10 VLCR STRL LF (GAUZE/BANDAGES/DRESSINGS) ×3 IMPLANT
BOWL SMART MIX CTS (DISPOSABLE) ×3 IMPLANT
CEMENT HV SMART SET (Cement) ×6 IMPLANT
COVER SURGICAL LIGHT HANDLE (MISCELLANEOUS) ×3 IMPLANT
COVER WAND RF STERILE (DRAPES) IMPLANT
CUFF TOURN SGL QUICK 34 (TOURNIQUET CUFF) ×2
CUFF TRNQT CYL 34X4.125X (TOURNIQUET CUFF) ×1 IMPLANT
DECANTER SPIKE VIAL GLASS SM (MISCELLANEOUS) ×9 IMPLANT
DRAPE U-SHAPE 47X51 STRL (DRAPES) ×3 IMPLANT
DRSG AQUACEL AG ADV 3.5X10 (GAUZE/BANDAGES/DRESSINGS) ×3 IMPLANT
DURAPREP 26ML APPLICATOR (WOUND CARE) ×3 IMPLANT
ELECT REM PT RETURN 15FT ADLT (MISCELLANEOUS) ×3 IMPLANT
GLOVE BIO SURGEON STRL SZ7.5 (GLOVE) ×3 IMPLANT
GLOVE BIO SURGEON STRL SZ8.5 (GLOVE) ×3 IMPLANT
GLOVE BIOGEL PI IND STRL 8 (GLOVE) ×1 IMPLANT
GLOVE BIOGEL PI IND STRL 9 (GLOVE) ×1 IMPLANT
GLOVE BIOGEL PI INDICATOR 8 (GLOVE) ×2
GLOVE BIOGEL PI INDICATOR 9 (GLOVE) ×2
GOWN STRL REUS W/TWL XL LVL3 (GOWN DISPOSABLE) ×6 IMPLANT
HANDPIECE INTERPULSE COAX TIP (DISPOSABLE) ×2
HOOD PEEL AWAY FLYTE STAYCOOL (MISCELLANEOUS) ×9 IMPLANT
KIT TURNOVER KIT A (KITS) IMPLANT
NEEDLE HYPO 21X1.5 SAFETY (NEEDLE) ×6 IMPLANT
NS IRRIG 1000ML POUR BTL (IV SOLUTION) ×3 IMPLANT
PACK ICE MAXI GEL EZY WRAP (MISCELLANEOUS) ×3 IMPLANT
PACK TOTAL KNEE CUSTOM (KITS) ×3 IMPLANT
PIN DRILL FIX HALF THREAD (BIT) ×3 IMPLANT
PIN STEINMAN FIXATION KNEE (PIN) ×3 IMPLANT
PROTECTOR NERVE ULNAR (MISCELLANEOUS) ×3 IMPLANT
SET HNDPC FAN SPRY TIP SCT (DISPOSABLE) ×1 IMPLANT
SUT VIC AB 1 CTX 36 (SUTURE) ×2
SUT VIC AB 1 CTX36XBRD ANBCTR (SUTURE) ×1 IMPLANT
SUT VIC AB 3-0 CT1 27 (SUTURE) ×6
SUT VIC AB 3-0 CT1 TAPERPNT 27 (SUTURE) ×3 IMPLANT
SYR CONTROL 10ML LL (SYRINGE) ×6 IMPLANT
TIBIAL BASE ROT PLAT SZ 7 KNEE (Knees) ×3 IMPLANT
TRAY FOLEY MTR SLVR 16FR STAT (SET/KITS/TRAYS/PACK) ×3 IMPLANT
WATER STERILE IRR 1000ML POUR (IV SOLUTION) ×6 IMPLANT
YANKAUER SUCT BULB TIP 10FT TU (MISCELLANEOUS) ×3 IMPLANT

## 2019-06-01 NOTE — Transfer of Care (Signed)
Immediate Anesthesia Transfer of Care Note  Patient: Jeffrey Chapman  Procedure(s) Performed: RIGHT TOTAL KNEE ARTHROPLASTY (Right Knee)  Patient Location: PACU  Anesthesia Type:Spinal  Level of Consciousness: awake, alert , oriented and patient cooperative  Airway & Oxygen Therapy: Patient Spontanous Breathing and Patient connected to face mask oxygen  Post-op Assessment: Report given to RN and Post -op Vital signs reviewed and stable  Post vital signs: Reviewed and stable  Last Vitals:  Vitals Value Taken Time  BP    Temp    Pulse 73 06/01/19 1235  Resp 14 06/01/19 1235  SpO2 89 % 06/01/19 1235  Vitals shown include unvalidated device data.  Last Pain:  Vitals:   06/01/19 1020  TempSrc:   PainSc: 0-No pain      Patients Stated Pain Goal: 4 ([id number])  Complications: No apparent anesthesia complications

## 2019-06-01 NOTE — Anesthesia Procedure Notes (Signed)
Spinal  Patient location during procedure: OR Start time: 06/01/2019 10:32 AM End time: 06/01/2019 10:37 AM Staffing Resident/CRNA: Garrel Ridgel, CRNA Performed: resident/CRNA  Preanesthetic Checklist Completed: patient identified, site marked, surgical consent, pre-op evaluation, timeout performed, IV checked, risks and benefits discussed and monitors and equipment checked Spinal Block Patient position: sitting Prep: DuraPrep Patient monitoring: heart rate, cardiac monitor, continuous pulse ox and blood pressure Approach: midline Location: L3-4 Injection technique: single-shot Needle Needle type: Pencan  Needle gauge: 24 G Needle length: 9 cm Needle insertion depth: 5 cm Assessment Sensory level: T10

## 2019-06-01 NOTE — Progress Notes (Signed)
Assisted Dr. Hatchett with right, ultrasound guided, adductor canal block. Side rails up, monitors on throughout procedure. See vital signs in flow sheet. Tolerated Procedure well.  

## 2019-06-01 NOTE — Anesthesia Procedure Notes (Signed)
Anesthesia Regional Block: Adductor canal block   Pre-Anesthetic Checklist: ,, timeout performed, Correct Patient, Correct Site, Correct Laterality, Correct Procedure, Correct Position, site marked, Risks and benefits discussed,  Surgical consent,  Pre-op evaluation,  At surgeon's request and post-op pain management  Laterality: Lower and Right  Prep: chloraprep       Needles:  Injection technique: Single-shot  Needle Type: Echogenic Stimulator Needle     Needle Length: 10cm  Needle Gauge: 21   Needle insertion depth: 2 cm   Additional Needles:   Procedures:,,,, ultrasound used (permanent image in chart),,,,  Narrative:  Start time: 06/01/2019 10:16 AM End time: 06/01/2019 10:26 AM Injection made incrementally with aspirations every 5 mL.  Performed by: Personally  Anesthesiologist: Lyn Hollingshead, MD

## 2019-06-01 NOTE — Interval H&P Note (Signed)
History and Physical Interval Note:  06/01/2019 10:16 AM  Jeffrey Chapman  has presented today for surgery, with the diagnosis of RIGHT  KNEE OSTEOARTHRITIS.  The various methods of treatment have been discussed with the patient and family. After consideration of risks, benefits and other options for treatment, the patient has consented to  Procedure(s): RIGHT TOTAL KNEE ARTHROPLASTY (Right) as a surgical intervention.  The patient's history has been reviewed, patient examined, no change in status, stable for surgery.  I have reviewed the patient's chart and labs.  Questions were answered to the patient's satisfaction.     [name]

## 2019-06-01 NOTE — Anesthesia Preprocedure Evaluation (Signed)
Anesthesia Evaluation  Patient identified by MRN, date of birth, ID band Patient awake    Reviewed: Allergy & Precautions, NPO status , Patient's Chart, lab work & pertinent test results  Airway Mallampati: II       Dental no notable dental hx. (+) Teeth Intact   Pulmonary neg pulmonary ROS,    Pulmonary exam normal breath sounds clear to auscultation       Cardiovascular hypertension, Pt. on medications Normal cardiovascular exam Rhythm:Regular Rate:Normal     Neuro/Psych Anxiety negative neurological ROS     GI/Hepatic negative GI ROS,   Endo/Other  negative endocrine ROS  Renal/GU negative Renal ROS  negative genitourinary   Musculoskeletal   Abdominal (+) + obese,   Peds  Hematology negative hematology ROS (+)   Anesthesia Other Findings   Reproductive/Obstetrics                             Anesthesia Physical Anesthesia Plan  ASA: II  Anesthesia Plan: Spinal   Post-op Pain Management:  Regional for Post-op pain   Induction:   PONV Risk Score and Plan: 1 and Ondansetron and Dexamethasone  Airway Management Planned: Natural Airway, Nasal Cannula and Simple Face Mask  Additional Equipment: None  Intra-op Plan:   Post-operative Plan:   Informed Consent: I have reviewed the patients History and Physical, chart, labs and discussed the procedure including the risks, benefits and alternatives for the proposed anesthesia with the patient or authorized representative who has indicated his/her understanding and acceptance.       Plan Discussed with: CRNA  Anesthesia Plan Comments:         Anesthesia Quick Evaluation

## 2019-06-01 NOTE — Care Plan (Signed)
Ortho Bundle Case Management Note  Patient Details  Name: Jeffrey Chapman MRN: [id number] Date of Birth: 09-07-1960   Spoke with patient prior to surgery. He plans to discharge to home with family. He is agreeable to minimal HHPT. Rolling walker ordered for home use. He will decide on OPPT after follow up appointment. MD and Patient aware of plan.  Choice offered.                  DME Arranged:  Gilford Rile rolling DME Agency:  [employer]  HH Arranged:  PT HH Agency:  [facility]  Additional Comments: Please contact me with any questions of if this plan should need to change.  Ladell Heads,  [place] [facility]  [phone] 06/01/2019, 2:46 PM

## 2019-06-01 NOTE — Op Note (Signed)
PATIENT ID:      Jeffrey Chapman  MRN:     [id number] DOB/AGE:    12-10-1960 / 58 y.o.       OPERATIVE REPORT   DATE OF PROCEDURE:  06/01/2019      PREOPERATIVE DIAGNOSIS:   RIGHT  KNEE OSTEOARTHRITIS      Estimated body mass index is 32.64 kg/m as calculated from the following:   Height as of this encounter: 5\' 9"  (1.753 m).   Weight as of this encounter: 100.2 kg.                                                       POSTOPERATIVE DIAGNOSIS:   RIGHT  KNEE OSTEOARTHRITIS                                                                       PROCEDURE:  Procedure(s): RIGHT TOTAL KNEE ARTHROPLASTY Using DepuyAttune RP implants #6R Femur, #7Tibia, 5 mm Attune RP bearing, 38 Patella    SURGEON: [name]  ASSISTANT:   Kerry Hough. [employer]   (Present and scrubbed throughout the case, critical for assistance with exposure, retraction, instrumentation, and closure.)        ANESTHESIA: Spinal, 20cc Exparel, 50cc 0.25% Marcaine EBL: 300 cc FLUID REPLACEMENT: 1600 cc crystaloid TOURNIQUET: DRAINS: None TRANEXAMIC ACID: 1gm IV, 2gm topical COMPLICATIONS:  None         INDICATIONS FOR PROCEDURE: The patient has  RIGHT  KNEE OSTEOARTHRITIS, Var deformities, XR shows bone on bone arthritis, lateral subluxation of tibia. Patient has failed all conservative measures including anti-inflammatory medicines, narcotics, attempts at exercise and weight loss, cortisone injections and viscosupplementation.  Risks and benefits of surgery have been discussed, questions answered.   DESCRIPTION OF PROCEDURE: The patient identified by armband, received  IV antibiotics, in the holding area at [facility]. Patient taken to the operating room, appropriate anesthetic monitors were attached, and Spinal anesthesia was  induced. IV Tranexamic acid was given.Tourniquet applied high to the operative thigh. Lateral post and foot positioner applied to the table, the lower extremity was then prepped and draped  in usual sterile fashion from the toes to the tourniquet. Time-out procedure was performed. The skin and subcutaneous tissue along the incision was injected with 20 cc of a mixture of Exparel and Marcaine solution, using a 20-gauge by 1-1/2 inch needle. We began the operation, with the knee flexed 130 degrees, by making the anterior midline incision starting at handbreadth above the patella going over the patella 1 cm medial to and 4 cm distal to the tibial tubercle. Small bleeders in the skin and the subcutaneous tissue identified and cauterized. Transverse retinaculum was incised and reflected medially and a medial parapatellar arthrotomy was accomplished. the patella was everted and theprepatellar fat pad resected. The superficial medial collateral ligament was then elevated from anterior to posterior along the proximal flare of the tibia and anterior half of the menisci resected. The knee was hyperflexed exposing bone on bone arthritis. Peripheral and notch osteophytes as well as the cruciate ligaments were then  resected. We continued to work our way around posteriorly along the proximal tibia, and externally rotated the tibia subluxing it out from underneath the femur. A McHale PCL retractor was placed through the notch and a lateral Hohmann retractor placed, and we then entered the proximal tibia in line with the Depuy starter drill in line with the axis of the tibia followed by an intramedullary guide rod and 0-degree posterior slope cutting guide. The tibial cutting guide, 4 degree posterior sloped, was pinned into place allowing resection of 4 mm of bone medially and 10 mm of bone laterally. Satisfied with the tibial resection, we then entered the distal femur 2 mm anterior to the PCL origin with the intramedullary guide rod and applied the distal femoral cutting guide set at 9 mm, with 5 degrees of valgus. This was pinned along the epicondylar axis. At this point, the distal femoral cut was accomplished  without difficulty. We then sized for a #6R femoral component and pinned the guide in 3 degrees of external rotation. The chamfer cutting guide was pinned into place. The anterior, posterior, and chamfer cuts were accomplished without difficulty followed by the Attune RP box cutting guide and the box cut. We also removed posterior osteophytes from the posterior femoral condyles. The posterior capsule was injected with Exparel solution. The knee was brought into full extension. We checked our extension gap and fit a 5 mm bearing. Distracting in extension with a lamina spreader,  bleeders in the posterior capsule, Posterior medial and posterior lateral gutter were cauterized.  The transexamic acid-soaked sponge was then placed in the gap of the knee in extension. The knee was flexed 30. The posterior patella cut was accomplished with the 9.5 mm Attune cutting guide, sized for a 34mm dome, and the fixation pegs drilled.The knee was then once again hyperflexed exposing the proximal tibia. We sized for a # 7 tibial base plate, applied the smokestack and the conical reamer followed by the the Delta fin keel punch. We then hammered into place the Attune RP trial femoral component, drilled the lugs, inserted a  38 mm trial bearing, trial patellar button, and took the knee through range of motion from 0-130 degrees. Medial and lateral ligamentous stability was checked. No thumb pressure was required for patellar Tracking. The tourniquet was not used. All trial components were removed, mating surfaces irrigated with pulse lavage, and dried with suction and sponges. 10 cc of the Exparel solution was applied to the cancellus bone of the patella distal femur and proximal tibia.  After waiting 30 seconds, the bony surfaces were again, dried with sponges. A double batch of DePuy HV cement was mixed and applied to all bony metallic mating surfaces except for the posterior condyles of the femur itself. In order, we hammered into  place the tibial tray and removed excess cement, the femoral component and removed excess cement. The final Attune RP bearing was inserted, and the knee brought to full extension with compression. The patellar button was clamped into place, and excess cement removed. The knee was held at 30 flexion with compression, while the cement cured. The wound was irrigated out with normal saline solution pulse lavage. The rest of the Exparel was injected into the parapatellar arthrotomy, subcutaneous tissues, and periosteal tissues. The parapatellar arthrotomy was closed with running #1 Vicryl suture. The subcutaneous tissue with 0 and 2-0 undyed Vicryl suture, and the skin with running 3-0 SQ vicryl. An Aquacil and Ace wrap were applied. The patient was taken to  recovery room without difficulty.   [name] 06/01/2019, 10:17 AM

## 2019-06-01 NOTE — Evaluation (Addendum)
Physical Therapy Evaluation Patient Details Name: Jeffrey Chapman MRN: [id number] DOB: 1960/10/04 Today's Date: 06/01/2019   History of Present Illness  Patient is 58 y.o. male s/p Rt TKA with PMH significant for HTN, HLD, and arthritis.  Clinical Impression  Jeffrey Chapman is a 58 y.o. male POD 0 s/p Rt TKA. Patient reports independence with mobility at baseline abd that he is very active and participates in regular gym routine 3x/week. Patient is now limited by functional impairments (see PT problem list below) and requires min assist for transfers and gait with RW. Patient was able to ambulate ~70 feet with RW and cues for safe use throughout. Patient instructed in exercise to facilitate ROM and circulation to manage edema. Patient will benefit from continued skilled PT interventions to address impairments and progress towards PLOF. Acute PT will follow to progress mobility and stair training in preparation for safe discharge home.     Follow Up Recommendations Follow surgeon's recommendation for DC plan and follow-up therapies    Equipment Recommendations  Rolling walker with 5" wheels    Recommendations for Other Services       Precautions / Restrictions Precautions Precautions: Fall Restrictions Weight Bearing Restrictions: No      Mobility  Bed Mobility Overal bed mobility: Needs Assistance Bed Mobility: Supine to Sit     Supine to sit: Supervision     General bed mobility comments: HOB elevated, pt using rails, no physical assist required  Transfers Overall transfer level: Needs assistance Equipment used: Rolling walker (2 wheeled) Transfers: Sit to/from Stand Sit to Stand: Min assist;From elevated surface         General transfer comment: pt required repeated cues for safety with transfer as he was somewhat impulsive to stand, pt unable to stand from low surface, pt required min assist from elevated surface to initiate power up and steady with rising, cues  requried for hand placement and technique  Ambulation/Gait Ambulation/Gait assistance: Min assist Gait Distance (Feet): 70 Feet Assistive device: Rolling walker (2 wheeled) Gait Pattern/deviations: Step-to pattern;Decreased step length - left;Decreased stride length;Decreased stance time - right;Decreased weight shift to right Gait velocity: decreased   General Gait Details: cues for safe hand placment required and for safe walker managment, cues for step sequencing. Pt reliant on UE support to unweight Rt LE.  Stairs            Wheelchair Mobility    Modified Rankin (Stroke Patients Only)       Balance Overall balance assessment: Needs assistance Sitting-balance support: No upper extremity supported;Feet supported Sitting balance-Leahy Scale: Good     Standing balance support: During functional activity;Bilateral upper extremity supported Standing balance-Leahy Scale: Fair              Pertinent Vitals/Pain Pain Assessment: 0-10 Pain Score: 7  Pain Location: Rt knee Pain Descriptors / Indicators: Burning;Sore Pain Intervention(s): Limited activity within patient's tolerance;Monitored during session;Repositioned;Patient requesting pain meds-RN notified;Ice applied    Home Living Family/patient expects to be discharged to:: Private residence Living Arrangements: Children;Spouse/significant other Available Help at Discharge: Family Type of Home: House Home Access: Stairs to enter Entrance Stairs-Rails: None Entrance Stairs-Number of Steps: 2 Home Layout: One level Home Equipment: Cane - single point      Prior Function Level of Independence: Independent               Hand Dominance        Extremity/Trunk Assessment   Upper Extremity Assessment Upper Extremity Assessment: Overall WFL for  tasks assessed    Lower Extremity Assessment Lower Extremity Assessment: Overall WFL for tasks assessed;RLE deficits/detail RLE Deficits / Details: pt with  good qud activaiton in supine and no extensor lag with SLR, pt with 4/5 or greater for LE strength, pt complaining of numbness however sensation appears intact for light touch RLE Sensation: WNL RLE Coordination: WNL    Cervical / Trunk Assessment Cervical / Trunk Assessment: Normal  Communication   Communication: No difficulties  Cognition Arousal/Alertness: Awake/alert Behavior During Therapy: WFL for tasks assessed/performed Overall Cognitive Status: Within Functional Limits for tasks assessed                 General Comments      Exercises Total Joint Exercises Ankle Circles/Pumps: AROM;10 reps;Seated;Both Quad Sets: AROM;10 reps;Supine;Seated;Right Heel Slides: AROM;Supine;Seated;10 reps;Right   Assessment/Plan    PT Assessment Patient needs continued PT services  PT Problem List Decreased range of motion;Decreased mobility;Decreased balance;Decreased strength;Decreased activity tolerance;Decreased coordination;Decreased knowledge of use of DME;Decreased safety awareness       PT Treatment Interventions DME instruction;Functional mobility training;Therapeutic activities;Gait training;Stair training;Therapeutic exercise;Balance training;Patient/family education;Modalities    PT Goals (Current goals can be found in the Care Plan section)  Acute Rehab PT Goals Patient Stated Goal: pt wants to get back to gym for power lifting PT Goal Formulation: With patient Time For Goal Achievement: 06/08/19 Potential to Achieve Goals: Good    Frequency 7X/week    AM-PAC PT "6 Clicks" Mobility  Outcome Measure Help needed turning from your back to your side while in a flat bed without using bedrails?: A Little Help needed moving from lying on your back to sitting on the side of a flat bed without using bedrails?: A Little Help needed moving to and from a bed to a chair (including a wheelchair)?: A Little Help needed standing up from a chair using your arms (e.g., wheelchair or  bedside chair)?: A Little Help needed to walk in hospital room?: A Little Help needed climbing 3-5 steps with a railing? : A Little 6 Click Score: 18    End of Session Equipment Utilized During Treatment: Gait belt Activity Tolerance: Patient tolerated treatment well Patient left: with call bell/phone within reach;in chair;with chair alarm set Nurse Communication: Mobility status PT Visit Diagnosis: Muscle weakness (generalized) (M62.81);Difficulty in walking, not elsewhere classified (R26.2);Other abnormalities of gait and mobility (R26.89)    Time: [id number] PT Time Calculation (min) (ACUTE ONLY): 33 min   Charges:   PT Evaluation $PT Eval Low Complexity: 1 Low PT Treatments $Gait Training: 8-22 mins       Kipp Brood, PT, DPT Physical Therapist with [facility]  06/01/2019 6:15 PM

## 2019-06-01 NOTE — Discharge Instructions (Signed)

## 2019-06-02 ENCOUNTER — Encounter (HOSPITAL_COMMUNITY): Payer: Self-pay | Admitting: Orthopedic Surgery

## 2019-06-02 DIAGNOSIS — Z96652 Presence of left artificial knee joint: Secondary | ICD-10-CM | POA: Diagnosis not present

## 2019-06-02 DIAGNOSIS — M170 Bilateral primary osteoarthritis of knee: Secondary | ICD-10-CM | POA: Diagnosis not present

## 2019-06-02 DIAGNOSIS — M1712 Unilateral primary osteoarthritis, left knee: Secondary | ICD-10-CM | POA: Diagnosis not present

## 2019-06-02 LAB — BASIC METABOLIC PANEL
Anion gap: 7 (ref 5–15)
BUN: 21 mg/dL — ABNORMAL HIGH (ref 6–20)
CO2: 23 mmol/L (ref 22–32)
Calcium: 7.6 mg/dL — ABNORMAL LOW (ref 8.9–10.3)
Chloride: 101 mmol/L (ref 98–111)
Creatinine, Ser: 1.25 mg/dL — ABNORMAL HIGH (ref 0.61–1.24)
GFR calc Af Amer: >60 mL/min (ref >60)
GFR calc non Af Amer: >60 mL/min (ref >60)
Glucose, Bld: 157 mg/dL — ABNORMAL HIGH (ref 70–99)
Potassium: 4.3 mmol/L (ref 3.5–5.1)
Sodium: 131 mmol/L — ABNORMAL LOW (ref 135–145)

## 2019-06-02 LAB — CBC
HCT: 49.2 % (ref 39.0–52.0)
Hemoglobin: 16.4 g/dL (ref 13.0–17.0)
MCH: 32.6 pg (ref 26.0–34.0)
MCHC: 33.3 g/dL (ref 30.0–36.0)
MCV: 97.8 fL (ref 80.0–100.0)
Platelets: 210 10*3/uL (ref 150–400)
RBC: 5.03 MIL/uL (ref 4.22–5.81)
RDW: 13.5 % (ref 11.5–15.5)
WBC: 17 10*3/uL — ABNORMAL HIGH (ref 4.0–10.5)
nRBC: 0 % (ref 0.0–0.2)

## 2019-06-02 MED ORDER — CELECOXIB 200 MG PO CAPS: 200.0000 mg | ORAL_CAPSULE | Freq: Two times a day (BID) | ORAL | 2 refills | Status: DC

## 2019-06-02 NOTE — Discharge Summary (Signed)
Patient ID: Jeffrey Chapman MRN: [id number] DOB/AGE: [birth year] 58 y.o.  Admit date: 06/01/2019 Discharge date: 06/02/2019  Admission Diagnoses:  Principal Problem:   Degenerative arthritis of left knee Active Problems:   S/P total knee replacement, right   Discharge Diagnoses:  Same  Past Medical History:  Diagnosis Date  . ADD (attention deficit disorder) 2015  . Anxiety   . Arthritis   . ED (erectile dysfunction) 2015  . Hepatitis B 1977  . High blood hemoglobin A2 (HCC)   . Hyperlipemia 2015  . Hypertension   . Hypoaldosteronism ([place]) 2015  . Hypogonadism, testicular 2015  . Seasonal allergies     Surgeries: Procedure(s): RIGHT TOTAL KNEE ARTHROPLASTY on 06/01/2019   Consultants:   Discharged Condition: Improved  Hospital Course: Jeffrey Chapman is an 58 y.o. male who was admitted 06/01/2019 for operative treatment ofDegenerative arthritis of left knee. Patient has severe unremitting pain that affects sleep, daily activities, and work/hobbies. After pre-op clearance the patient was taken to the operating room on 06/01/2019 and underwent  Procedure(s): RIGHT TOTAL KNEE ARTHROPLASTY.    Patient was given perioperative antibiotics:  Anti-infectives (From admission, onward)   Start     Dose/Rate Route Frequency Ordered Stop   06/01/19 0930  ceFAZolin (ANCEF) IVPB 2g/100 mL premix     2 g 200 mL/hr over 30 Minutes Intravenous On call to O.R. 06/01/19 [id number] 06/01/19 1043       Patient was given sequential compression devices, early ambulation, and chemoprophylaxis to prevent DVT.  Patient benefited maximally from hospital stay and there were no complications.    Recent vital signs:  Patient Vitals for the past 24 hrs:  BP Temp Temp src Pulse Resp SpO2  06/02/19 1333 139/85 98 F (36.7 C) Oral 77 18 98 %  06/02/19 1003 112/68 98 F (36.7 C) Oral 70 18 98 %  06/02/19 0457 102/67 97.6 F (36.4 C) - 64 18 94 %  06/02/19 0045 100/68 98.4 F (36.9 C) - 66 18  97 %  06/01/19 2054 107/76 (!) 97.5 F (36.4 C) - 80 18 96 %  06/01/19 1758 105/68 98.3 F (36.8 C) - 93 - 95 %  06/01/19 1627 136/88 97.8 F (36.6 C) - 79 17 96 %  06/01/19 1532 129/84 98.5 F (36.9 C) - 83 17 98 %     Recent laboratory studies:  Recent Labs    06/02/19 0351  WBC 17.0*  HGB 16.4  HCT 49.2  PLT 210  NA 131*  K 4.3  CL 101  CO2 23  BUN 21*  CREATININE 1.25*  GLUCOSE 157*  CALCIUM 7.6*     Discharge Medications:   Allergies as of 06/02/2019      Reactions   Flexeril [cyclobenzaprine] Anaphylaxis   Lactose Intolerance (gi)    Dairy-makes him wheeze   Wheat Bran    Makes him wheeze      Medication List    STOP taking these medications   TRAMADOL HCL PO     TAKE these medications   aspirin EC 81 MG tablet Take 1 tablet (81 mg total) by mouth 2 (two) times daily.   celecoxib 200 MG capsule Commonly known as: CeleBREX Take 1 capsule (200 mg total) by mouth 2 (two) times daily.   DIALYVITE VITAMIN D 5000 PO Take 5,000 Units by mouth daily.   lisinopril 20 MG tablet Commonly known as: ZESTRIL Take 20 mg by mouth daily.   loratadine 10 MG tablet Commonly known as: CLARITIN Take  10 mg by mouth daily.   magnesium gluconate 500 MG tablet Commonly known as: MAGONATE Take 500 mg by mouth daily.   multivitamin with minerals Tabs tablet Take 1 tablet by mouth daily.   oxyCODONE-acetaminophen 5-325 MG tablet Commonly known as: PERCOCET/ROXICET Take 1 tablet by mouth every 4 (four) hours as needed for severe pain.   tiZANidine 2 MG tablet Commonly known as: ZANAFLEX Take 1 tablet (2 mg total) by mouth every 6 (six) hours as needed.            Durable Medical Equipment  (From admission, onward)         Start     Ordered   06/01/19 1445  DME Walker rolling  Once    Question:  Patient needs a walker to treat with the following condition  Answer:  Status post right knee replacement   06/01/19 1444   06/01/19 1445  DME 3 n 1  Once      06/01/19 1444           Discharge Care Instructions  (From admission, onward)         Start     Ordered   06/02/19 0000  Change dressing    Comments: Change dressing Only if drainage exceeds 40% of window on dressing   06/02/19 0706          Diagnostic Studies: Dg Chest 2 View  Result Date: 05/28/2019 CLINICAL DATA:  Preop for knee replacement. EXAM: CHEST - 2 VIEW COMPARISON:  None. FINDINGS: The heart size and mediastinal contours are within normal limits. Both lungs are clear. The visualized skeletal structures are unremarkable. IMPRESSION: No active cardiopulmonary disease. Electronically Signed   By: Marijo Conception M.D.   On: 05/28/2019 14:53    Disposition: Discharge disposition: 01-Home or Self Care       Discharge Instructions    Call MD / Call 911   Complete by: As directed    If you experience chest pain or shortness of breath, CALL 911 and be transported to the hospital emergency room.  If you develope a fever above 101 F, pus (white drainage) or increased drainage or redness at the wound, or calf pain, call your surgeon's office.   Change dressing   Complete by: As directed    Change dressing Only if drainage exceeds 40% of window on dressing   Constipation Prevention   Complete by: As directed    Drink plenty of fluids.  Prune juice may be helpful.  You may use a stool softener, such as Colace (over the counter) 100 mg twice a day.  Use MiraLax (over the counter) for constipation as needed.   Diet - low sodium heart healthy   Complete by: As directed    Increase activity slowly as tolerated   Complete by: As directed       Follow-up Information    Frederik Pear, MD. Go on 06/16/2019.   Specialty: Orthopedic Surgery Why: Your appointment has been scheduled for 1030  Contact information: [place] [address] [phone]        [facility] Follow up.   Why: You will be seen at home by HHPT for 5 visits prior  to physician follow up  Contact information: [address] [place], [facility]. Go on 06/17/2019.   Specialty: Physical Therapy Why: You are scheduled to start outpatient physical therapy at 1100.  Please arrive a few minutes early to complete your paperwork  Contact information: [place] [address] [phone]            Signed: Joanell Rising 06/02/2019, 3:17 PM

## 2019-06-02 NOTE — Anesthesia Postprocedure Evaluation (Addendum)
Anesthesia Post Note  Patient: Advertising account executive  Procedure(s) Performed: RIGHT TOTAL KNEE ARTHROPLASTY (Right Knee)     Patient location during evaluation: PACU Anesthesia Type: Spinal Level of consciousness: awake Pain management: pain level controlled Vital Signs Assessment: post-procedure vital signs reviewed and stable Respiratory status: spontaneous breathing Cardiovascular status: stable Postop Assessment: no apparent nausea or vomiting, no headache, no backache and spinal receding Anesthetic complications: no    Last Vitals:  Vitals:   06/02/19 0457 06/02/19 1003  BP: 102/67 112/68  Pulse: 64 70  Resp: 18 18  Temp: 36.4 C 36.7 C  SpO2: 94% 98%    Last Pain:  Vitals:   06/02/19 1003  TempSrc: Oral  PainSc:    Pain Goal: Patients Stated Pain Goal: 4 (06/01/19 1800)                 Huston Foley

## 2019-06-02 NOTE — Progress Notes (Signed)
Physical Therapy Treatment Patient Details Name: Jeffrey Chapman MRN: [id number] DOB: Oct 27, 1960 Today's Date: 06/02/2019    History of Present Illness Patient is 58 y.o. male s/p Rt TKA with PMH significant for HTN, HLD, and arthritis.    PT Comments    POD # 1 pm session "Sigificant other" present during session.  Assisted with amb in hallway with instruction on safe handling.  Practiced stairs. General Gait Details: 255 VC's on proper walker to self distance, upright posture and safety with turns.  Then returned to room to perform some TE's following HEP handout.  Instructed on proper tech, freq as well as use of ICE.   Addressed all mobility questions, discussed appropriate activity, educated on use of ICE.  Pt ready for D/C to home.    Follow Up Recommendations  Follow surgeon's recommendation for DC plan and follow-up therapies     Equipment Recommendations  Rolling walker with 5" wheels    Recommendations for Other Services       Precautions / Restrictions Precautions Precautions: Fall Precaution Comments: instructed no pillow under knee Restrictions Weight Bearing Restrictions: No    Mobility  Bed Mobility               General bed mobility comments: OOB in recliner  Transfers Overall transfer level: Needs assistance Equipment used: Rolling walker (2 wheeled) Transfers: Sit to/from Stand Sit to Stand: Supervision;Min guard         General transfer comment: 25% VC's on proper hand placement and safety with turns  Ambulation/Gait Ambulation/Gait assistance: Supervision;Min guard Gait Distance (Feet): 75 Feet Assistive device: Rolling walker (2 wheeled) Gait Pattern/deviations: Step-to pattern;Decreased step length - left;Decreased stride length;Decreased stance time - right;Decreased weight shift to right Gait velocity: decreased   General Gait Details: 255 VC's on proper walker to self distance, upright posture and safety with  turns   Stairs Stairs: Yes Stairs assistance: Min assist Stair Management: No rails;Step to pattern;Forwards;With walker Number of Stairs: 2 General stair comments: with "sig other" present for "hands on" instruction proper tech up forward with walker and down forward with walker while care giver supports walker placement   Wheelchair Mobility    Modified Rankin (Stroke Patients Only)       Balance                                            Cognition Arousal/Alertness: Awake/alert Behavior During Therapy: WFL for tasks assessed/performed Overall Cognitive Status: Within Functional Limits for tasks assessed                                        Exercises      General Comments        Pertinent Vitals/Pain Pain Assessment: 0-10 Pain Score: 7  Pain Location: Rt knee esp "back inner" Pain Descriptors / Indicators: Burning;Sore;Tender;Tightness Pain Intervention(s): Monitored during session;Repositioned;Ice applied;Premedicated before session    Home Living                      Prior Function            PT Goals (current goals can now be found in the care plan section) Progress towards PT goals: Progressing toward goals    Frequency    7X/week  PT Plan Current plan remains appropriate    Co-evaluation              AM-PAC PT "6 Clicks" Mobility   Outcome Measure  Help needed turning from your back to your side while in a flat bed without using bedrails?: A Little Help needed moving from lying on your back to sitting on the side of a flat bed without using bedrails?: A Little Help needed moving to and from a bed to a chair (including a wheelchair)?: A Little Help needed standing up from a chair using your arms (e.g., wheelchair or bedside chair)?: A Little Help needed to walk in hospital room?: A Little Help needed climbing 3-5 steps with a railing? : A Lot 6 Click Score: 17    End of Session  Equipment Utilized During Treatment: Gait belt Activity Tolerance: Patient tolerated treatment well Patient left: with call bell/phone within reach;in chair;with chair alarm set   PT Visit Diagnosis: Muscle weakness (generalized) (M62.81);Difficulty in walking, not elsewhere classified (R26.2);Other abnormalities of gait and mobility (R26.89)     Time: 1420-1445 PT Time Calculation (min) (ACUTE ONLY): 25 min  Charges:  $Gait Training: 8-22 mins $Therapeutic Activity: 8-22 mins                     Rica Koyanagi  PTA Acute  Rehabilitation Services Pager      [phone] Office      [phone]

## 2019-06-02 NOTE — Plan of Care (Signed)
  Problem: Activity: Goal: Risk for activity intolerance will decrease Outcome: Progressing   Problem: Pain Managment: Goal: General experience of comfort will improve Outcome: Progressing   Problem: Education: Goal: Knowledge of the prescribed therapeutic regimen will improve Outcome: Progressing   Problem: Activity: Goal: Ability to avoid complications of mobility impairment will improve Outcome: Progressing   Problem: Activity: Goal: Range of joint motion will improve Outcome: Progressing   Problem: Clinical Measurements: Goal: Postoperative complications will be avoided or minimized Outcome: Progressing

## 2019-06-02 NOTE — Discharge Summary (Signed)
Patient ID: Jeffrey Chapman MRN: [id number] DOB/AGE: Jan 05, 1961 58 y.o.  Admit date: 06/01/2019 Discharge date: 06/02/2019  Admission Diagnoses:  Principal Problem:   Degenerative arthritis of left knee Active Problems:   S/P total knee replacement, right   Discharge Diagnoses:  Same  Past Medical History:  Diagnosis Date  . ADD (attention deficit disorder) 2015  . Anxiety   . Arthritis   . ED (erectile dysfunction) 2015  . Hepatitis B 1977  . High blood hemoglobin A2 (HCC)   . Hyperlipemia 2015  . Hypertension   . Hypoaldosteronism ([place]) 2015  . Hypogonadism, testicular 2015  . Seasonal allergies     Surgeries: Procedure(s): RIGHT TOTAL KNEE ARTHROPLASTY on 06/01/2019   Consultants:   Discharged Condition: Improved  Hospital Course: Jeffrey Chapman is an 58 y.o. male who was admitted 06/01/2019 for operative treatment ofDegenerative arthritis of left knee. Patient has severe unremitting pain that affects sleep, daily activities, and work/hobbies. After pre-op clearance the patient was taken to the operating room on 06/01/2019 and underwent  Procedure(s): RIGHT TOTAL KNEE ARTHROPLASTY.    Patient was given perioperative antibiotics:  Anti-infectives (From admission, onward)   Start     Dose/Rate Route Frequency Ordered Stop   06/01/19 0930  ceFAZolin (ANCEF) IVPB 2g/100 mL premix     2 g 200 mL/hr over 30 Minutes Intravenous On call to O.R. 06/01/19 [id number] 06/01/19 1043       Patient was given sequential compression devices, early ambulation, and chemoprophylaxis to prevent DVT.  Patient benefited maximally from hospital stay and there were no complications.    Recent vital signs:  Patient Vitals for the past 24 hrs:  BP Temp Temp src Pulse Resp SpO2 Height Weight  06/02/19 0457 102/67 97.6 F (36.4 C) no documentation 64 18 94 % no documentation no documentation  06/02/19 0045 100/68 98.4 F (36.9 C) no documentation 66 18 97 % no documentation no  documentation  06/01/19 2054 107/76 (Abnormal) 97.5 F (36.4 C) no documentation 80 18 96 % no documentation no documentation  06/01/19 1758 105/68 98.3 F (36.8 C) no documentation 93 no documentation 95 % no documentation no documentation  06/01/19 1627 136/88 97.8 F (36.6 C) no documentation 79 17 96 % no documentation no documentation  06/01/19 1532 129/84 98.5 F (36.9 C) no documentation 83 17 98 % no documentation no documentation  06/01/19 1424 105/78 (Abnormal) 97.4 F (36.3 C) Oral 72 14 98 % no documentation no documentation  06/01/19 1400 107/72 (Abnormal) 97.5 F (36.4 C) no documentation 72 16 100 % no documentation no documentation  06/01/19 1345 109/76 no documentation no documentation 66 16 98 % no documentation no documentation  06/01/19 1330 98/73 no documentation no documentation 66 13 95 % no documentation no documentation  06/01/19 1315 105/77 no documentation no documentation 66 14 97 % no documentation no documentation  06/01/19 1300 103/72 no documentation no documentation 66 12 97 % no documentation no documentation  06/01/19 1245 109/73 no documentation no documentation 67 10 98 % no documentation no documentation  06/01/19 1235 109/67 98.2 F (36.8 C) no documentation 74 13 98 % no documentation no documentation  06/01/19 1020 129/84 no documentation no documentation 82 18 96 % no documentation no documentation  06/01/19 1015 (Abnormal) 151/101 no documentation no documentation 83 14 98 % no documentation no documentation  06/01/19 1000 (Abnormal) 149/86 no documentation no documentation 81 20 96 % no documentation no documentation  06/01/19 0940 no documentation no documentation no documentation no  documentation no documentation no documentation 5\' 9"  (1.753 m) 100.2 kg  06/01/19 0937 (Abnormal) 163/88 97.8 F (36.6 C) Oral 88 (Abnormal) 23 98 % no documentation no documentation     Recent laboratory studies:  Recent Labs    06/02/19 0351  WBC 17.0*  HGB  16.4  HCT 49.2  PLT 210  NA 131*  K 4.3  CL 101  CO2 23  BUN 21*  CREATININE 1.25*  GLUCOSE 157*  CALCIUM 7.6*     Discharge Medications:   Allergies as of 06/02/2019    Allergen Reactions Comment   Flexeril [cyclobenzaprine] Anaphylaxis    Lactose Intolerance (gi)  Dairy-makes him wheeze   Wheat Bran  Makes him wheeze      Medication List    Stop taking these medications   TRAMADOL HCL PO     Take these medications   aspirin EC 81 MG tablet Take 1 tablet (81 mg total) by mouth 2 (two) times daily.   DIALYVITE VITAMIN D 5000 PO Take 5,000 Units by mouth daily.   lisinopril 20 MG tablet Commonly known as: ZESTRIL Take 20 mg by mouth daily.   loratadine 10 MG tablet Commonly known as: CLARITIN Take 10 mg by mouth daily.   magnesium gluconate 500 MG tablet Commonly known as: MAGONATE Take 500 mg by mouth daily.   multivitamin with minerals Tabs tablet Take 1 tablet by mouth daily.   oxyCODONE-acetaminophen 5-325 MG tablet Commonly known as: PERCOCET/ROXICET Take 1 tablet by mouth every 4 (four) hours as needed for severe pain.   tiZANidine 2 MG tablet Commonly known as: ZANAFLEX Take 1 tablet (2 mg total) by mouth every 6 (six) hours as needed.        Durable Medical Equipment  (From admission, onward)         Start     Ordered   06/01/19 1445  DME Walker rolling  Once    Question:  Patient needs a walker to treat with the following condition  Answer:  Status post right knee replacement   06/01/19 1444   06/01/19 1445  DME 3 n 1  Once     06/01/19 1444           Discharge Care Instructions  (From admission, onward)         Start     Ordered   06/02/19 0000  Change dressing    Comments: Change dressing Only if drainage exceeds 40% of window on dressing   06/02/19 0706          Diagnostic Studies: Dg Chest 2 View  Result Date: 05/28/2019 CLINICAL DATA:  Preop for knee replacement. EXAM: CHEST - 2 VIEW COMPARISON:  None. FINDINGS:  The heart size and mediastinal contours are within normal limits. Both lungs are clear. The visualized skeletal structures are unremarkable. IMPRESSION: No active cardiopulmonary disease. Electronically Signed   By: Marijo Conception M.D.   On: 05/28/2019 14:53    Disposition: Discharge disposition: 01-Home or Self Care       Discharge Instructions    Call MD / Call 911   Complete by: As directed    If you experience chest pain or shortness of breath, CALL 911 and be transported to the hospital emergency room.  If you develope a fever above 101 F, pus (white drainage) or increased drainage or redness at the wound, or calf pain, call your surgeon's office.   Change dressing   Complete by: As directed  Change dressing Only if drainage exceeds 40% of window on dressing   Constipation Prevention   Complete by: As directed    Drink plenty of fluids.  Prune juice may be helpful.  You may use a stool softener, such as Colace (over the counter) 100 mg twice a day.  Use MiraLax (over the counter) for constipation as needed.   Diet - low sodium heart healthy   Complete by: As directed    Increase activity slowly as tolerated   Complete by: As directed       Follow-up Information    Frederik Pear, MD. Go on 06/16/2019.   Specialty: Orthopedic Surgery Why: Your appointment has been scheduled for 1030  Contact information: [place] [address] [phone]        [facility] Follow up.   Why: You will be seen at home by HHPT for 5 visits prior to physician follow up  Contact information: [place], [facility]. Go on 06/17/2019.   Specialty: Physical Therapy Why: You are scheduled to start outpatient physical therapy at 1100. Please arrive a few minutes early to complete your paperwork  Contact information: [place] [address] [phone]             Signed: [name] 06/02/2019, 7:09 AM

## 2019-06-02 NOTE — Progress Notes (Signed)
Physical Therapy Treatment Patient Details Name: Jeffrey Chapman MRN: [id number] DOB: 1960-11-23 Today's Date: 06/02/2019    History of Present Illness Patient is 58 y.o. male s/p Rt TKA with PMH significant for HTN, HLD, and arthritis.    PT Comments    POD # 1 am session Assisted with amb.  General Gait Details: 255 VC's on proper walker to self distance, upright posture and safety with turns.  Then returned to room to perform some TE's following HEP handout.  Instructed on proper tech, freq as well as use of ICE.   Pt will need another session to address stairs.    Follow Up Recommendations  Follow surgeon's recommendation for DC plan and follow-up therapies     Equipment Recommendations  Rolling walker with 5" wheels    Recommendations for Other Services       Precautions / Restrictions Precautions Precautions: Fall Precaution Comments: instructed no pillow under knee Restrictions Weight Bearing Restrictions: No    Mobility  Bed Mobility               General bed mobility comments: OOB in recliner  Transfers Overall transfer level: Needs assistance Equipment used: Rolling walker (2 wheeled) Transfers: Sit to/from Stand Sit to Stand: Supervision;Min guard         General transfer comment: 25% VC's on proper hand placement and safety with turns  Ambulation/Gait Ambulation/Gait assistance: Supervision;Min guard Gait Distance (Feet): 75 Feet Assistive device: Rolling walker (2 wheeled) Gait Pattern/deviations: Step-to pattern;Decreased step length - left;Decreased stride length;Decreased stance time - right;Decreased weight shift to right Gait velocity: decreased   General Gait Details: 255 VC's on proper walker to self distance, upright posture and safety with turns   Chief Strategy Officer    Modified Rankin (Stroke Patients Only)       Balance                                             Cognition Arousal/Alertness: Awake/alert Behavior During Therapy: WFL for tasks assessed/performed Overall Cognitive Status: Within Functional Limits for tasks assessed                                        Exercises   Total Knee Replacement TE's 10 reps B LE ankle pumps 10 reps towel squeezes 10 reps knee presses 10 reps heel slides  10 reps SAQ's 10 reps SLR's 10 reps ABD Followed by ICE     General Comments        Pertinent Vitals/Pain Pain Assessment: 0-10 Pain Score: 7  Pain Location: Rt knee esp "back inner" Pain Descriptors / Indicators: Burning;Sore;Tender;Tightness Pain Intervention(s): Monitored during session;Repositioned;Ice applied;Premedicated before session    Home Living                      Prior Function            PT Goals (current goals can now be found in the care plan section) Progress towards PT goals: Progressing toward goals    Frequency    7X/week      PT Plan Current plan remains appropriate    Co-evaluation  AM-PAC PT "6 Clicks" Mobility   Outcome Measure  Help needed turning from your back to your side while in a flat bed without using bedrails?: A Little Help needed moving from lying on your back to sitting on the side of a flat bed without using bedrails?: A Little Help needed moving to and from a bed to a chair (including a wheelchair)?: A Little Help needed standing up from a chair using your arms (e.g., wheelchair or bedside chair)?: A Little Help needed to walk in hospital room?: A Little Help needed climbing 3-5 steps with a railing? : A Lot 6 Click Score: 17    End of Session Equipment Utilized During Treatment: Gait belt Activity Tolerance: Patient tolerated treatment well Patient left: with call bell/phone within reach;in chair;with chair alarm set   PT Visit Diagnosis: Muscle weakness (generalized) (M62.81);Difficulty in walking, not elsewhere classified  (R26.2);Other abnormalities of gait and mobility (R26.89)     Time: 1045-1110 PT Time Calculation (min) (ACUTE ONLY): 25 min  Charges:  $Gait Training: 8-22 mins $Therapeutic Exercise: 8-22 mins                     Rica Koyanagi  PTA Acute  Rehabilitation Services Pager      [phone] Office      [phone]

## 2019-06-02 NOTE — Progress Notes (Signed)
PATIENT ID: Jeffrey Chapman  MRN: [id number]  DOB/AGE:  12/02/1960 / 58 y.o.  1 Day Post-Op Procedure(s) (LRB): RIGHT TOTAL KNEE ARTHROPLASTY (Right)    PROGRESS NOTE Subjective: Patient is alert, oriented, no Nausea, no Vomiting, yes passing gas. Taking PO well. Denies SOB, Chest or Calf Pain. Using Incentive Spirometer, PAS in place. Ambulate 70', Patient reports pain as 2/10 .    Objective: Vital signs in last 24 hours: Vitals:   06/01/19 1758 06/01/19 2054 06/02/19 0045 06/02/19 0457  BP: 105/68 107/76 100/68 102/67  Pulse: 93 80 66 64  Resp:  18 18 18   Temp: 98.3 F (36.8 C) (Abnormal) 97.5 F (36.4 C) 98.4 F (36.9 C) 97.6 F (36.4 C)  TempSrc:      SpO2: 95% 96% 97% 94%  Weight:      Height:          Intake/Output from previous day: I/O last 3 completed shifts: In: 3842.7 [P.O.:480; I.V.:3262.7; IV Piggyback:100] Out: 2500 [Urine:2325; Blood:175]   Intake/Output this shift: No intake/output data recorded.   LABORATORY DATA: Recent Labs    06/02/19 0351  WBC 17.0*  HGB 16.4  HCT 49.2  PLT 210  NA 131*  K 4.3  CL 101  CO2 23  BUN 21*  CREATININE 1.25*  GLUCOSE 157*  CALCIUM 7.6*    Examination: Neurologically intact ABD soft Neurovascular intact Sensation intact distally Intact pulses distally Dorsiflexion/Plantar flexion intact Incision: dressing C/D/I No cellulitis present Compartment soft}  Assessment:   1 Day Post-Op Procedure(s) (LRB): RIGHT TOTAL KNEE ARTHROPLASTY (Right) ADDITIONAL DIAGNOSIS: Expected Acute Blood Loss Anemia,   Patient's anticipated LOS is less than 2 midnights, meeting these requirements: - Younger than 102 - Lives within 1 hour of care - Has a competent adult at home to recover with post-op recover - NO history of  - Chronic pain requiring opiods  - Diabetes  - Coronary Artery Disease  - Heart failure  - Heart attack  - Stroke  - DVT/VTE  - Cardiac arrhythmia  - Respiratory Failure/COPD  - Renal  failure  - Anemia  - Advanced Liver disease       Plan: PT/OT WBAT, AROM and PROM  DVT Prophylaxis:  SCDx72hrs, ASA 81 mg BID x 2 weeks DISCHARGE PLAN: Home, after PT today. DISCHARGE NEEDS: HHPT, Walker and 3-in-1 comode seat     [name] 06/02/2019, 7:05 AM Patient ID: Jeffrey Chapman, male   DOB: 1961/07/22, 58 y.o.   MRN: [id number]

## 2019-06-03 DIAGNOSIS — E2740 Unspecified adrenocortical insufficiency: Secondary | ICD-10-CM | POA: Diagnosis not present

## 2019-06-03 DIAGNOSIS — I10 Essential (primary) hypertension: Secondary | ICD-10-CM | POA: Diagnosis not present

## 2019-06-03 DIAGNOSIS — Z96651 Presence of right artificial knee joint: Secondary | ICD-10-CM | POA: Diagnosis not present

## 2019-06-03 DIAGNOSIS — E291 Testicular hypofunction: Secondary | ICD-10-CM | POA: Diagnosis not present

## 2019-06-03 DIAGNOSIS — Z471 Aftercare following joint replacement surgery: Secondary | ICD-10-CM | POA: Diagnosis not present

## 2019-06-03 DIAGNOSIS — R69 Illness, unspecified: Secondary | ICD-10-CM | POA: Diagnosis not present

## 2019-06-03 DIAGNOSIS — J309 Allergic rhinitis, unspecified: Secondary | ICD-10-CM | POA: Diagnosis not present

## 2019-06-03 DIAGNOSIS — E785 Hyperlipidemia, unspecified: Secondary | ICD-10-CM | POA: Diagnosis not present

## 2019-06-03 DIAGNOSIS — M1712 Unilateral primary osteoarthritis, left knee: Secondary | ICD-10-CM | POA: Diagnosis not present

## 2019-06-04 DIAGNOSIS — E785 Hyperlipidemia, unspecified: Secondary | ICD-10-CM | POA: Diagnosis not present

## 2019-06-04 DIAGNOSIS — E291 Testicular hypofunction: Secondary | ICD-10-CM | POA: Diagnosis not present

## 2019-06-04 DIAGNOSIS — M1712 Unilateral primary osteoarthritis, left knee: Secondary | ICD-10-CM | POA: Diagnosis not present

## 2019-06-04 DIAGNOSIS — Z471 Aftercare following joint replacement surgery: Secondary | ICD-10-CM | POA: Diagnosis not present

## 2019-06-04 DIAGNOSIS — I10 Essential (primary) hypertension: Secondary | ICD-10-CM | POA: Diagnosis not present

## 2019-06-04 DIAGNOSIS — E2740 Unspecified adrenocortical insufficiency: Secondary | ICD-10-CM | POA: Diagnosis not present

## 2019-06-04 DIAGNOSIS — R69 Illness, unspecified: Secondary | ICD-10-CM | POA: Diagnosis not present

## 2019-06-04 DIAGNOSIS — J309 Allergic rhinitis, unspecified: Secondary | ICD-10-CM | POA: Diagnosis not present

## 2019-06-04 DIAGNOSIS — Z96651 Presence of right artificial knee joint: Secondary | ICD-10-CM | POA: Diagnosis not present

## 2019-06-05 NOTE — H&P (Signed)
TOTAL KNEE ADMISSION H&P  Patient is being admitted for Right total knee arthroplasty.  Subjective:  Chief Complaint: Right knee pain.  HPI: Jeffrey Chapman, 58 y.o. male, has a history of pain and functional disability in the  Right knee due to arthritis and has failed non-surgical conservative treatments for greater than 12 weeks to includeNSAID's and/or analgesics, corticosteriod injections, flexibility and strengthening excercises and activity modification.  Onset of symptoms was gradual, starting several years ago with gradually worsening course since that time. The patient noted prior procedures on the knee to include  arthroscopy on the left knee(s).  Patient currently rates pain in the right knee(s) at 10 out of 10 with activity. Patient has night pain, worsening of pain with activity and weight bearing, pain that interferes with activities of daily living, pain with passive range of motion, crepitus and joint swelling.  Patient has evidence of subchondral sclerosis, periarticular osteophytes and joint space narrowing by imaging studies.   There is no active infection.  Patient Active Problem List   Diagnosis Date Noted  . S/P total knee replacement, right 06/01/2019  . Degenerative arthritis of left knee 05/28/2019   Past Medical History:  Diagnosis Date  . ADD (attention deficit disorder) 2015  . Anxiety   . Arthritis   . ED (erectile dysfunction) 2015  . Hepatitis B 1977  . High blood hemoglobin A2 ([facility])   . Hyperlipemia 2015  . Hypertension   . Hypoaldosteronism ([place]) 2015  . Hypogonadism, testicular 2015  . Seasonal allergies     Past Surgical History:  Procedure Laterality Date  . KNEE ARTHROSCOPY W/ MENISCAL REPAIR Left   . TOTAL KNEE ARTHROPLASTY Right 06/01/2019   Procedure: RIGHT TOTAL KNEE ARTHROPLASTY;  Surgeon: Frederik Pear, MD;  Location: [facility];  Service: Orthopedics;  Laterality: Right;    No current facility-administered medications for this encounter.     Current Outpatient Medications  Medication Sig Dispense Refill Last Dose  . Cholecalciferol (DIALYVITE VITAMIN D 5000 PO) Take 5,000 Units by mouth daily.   05/31/2019 at Unknown time  . lisinopril (PRINIVIL,ZESTRIL) 20 MG tablet Take 20 mg by mouth daily.    06/01/2019 at 0730  . loratadine (CLARITIN) 10 MG tablet Take 10 mg by mouth daily.   05/31/2019 at Unknown time  . magnesium gluconate (MAGONATE) 500 MG tablet Take 500 mg by mouth daily.   05/31/2019 at Unknown time  . Multiple Vitamin (MULTIVITAMIN WITH MINERALS) TABS tablet Take 1 tablet by mouth daily.   05/31/2019 at Unknown time  . aspirin EC 81 MG tablet Take 1 tablet (81 mg total) by mouth 2 (two) times daily. 60 tablet 0   . celecoxib (CELEBREX) 200 MG capsule Take 1 capsule (200 mg total) by mouth 2 (two) times daily. 60 capsule 2   . oxyCODONE-acetaminophen (PERCOCET/ROXICET) 5-325 MG tablet Take 1 tablet by mouth every 4 (four) hours as needed for severe pain. 30 tablet 0   . tiZANidine (ZANAFLEX) 2 MG tablet Take 1 tablet (2 mg total) by mouth every 6 (six) hours as needed. 60 tablet 0    Allergies  Allergen Reactions  . Flexeril [Cyclobenzaprine] Anaphylaxis  . Lactose Intolerance (Gi)     Dairy-makes him wheeze  . Wheat Bran     Makes him wheeze    Social History   Tobacco Use  . Smoking status: Never Smoker  . Smokeless tobacco: Never Used  Substance Use Topics  . Alcohol use: Yes    Alcohol/week: 1.0 standard drinks  Types: 1 Shots of liquor per week    History reviewed. No pertinent family history.   Review of Systems  Constitutional: Negative.   HENT: Negative.   Eyes: Negative.   Respiratory: Negative.   Cardiovascular: Negative.   Gastrointestinal: Negative.   Genitourinary: Negative.   Musculoskeletal: Positive for joint pain.  Skin: Negative.   Neurological: Negative.   Endo/Heme/Allergies: Negative.   Psychiatric/Behavioral: Negative.     Objective:  Physical Exam  Constitutional:  He is oriented to person, place, and time. He appears well-developed and well-nourished.  HENT:  Head: Normocephalic and atraumatic.  Eyes: Pupils are equal, round, and reactive to light.  Neck: Normal range of motion. Neck supple.  Cardiovascular: Intact distal pulses.  Respiratory: Effort normal.  Musculoskeletal:        General: Tenderness present.     Comments: patient has a range from 0-110 on the left and 0-115 on the right.  Obvious crepitance with range of motion.  Tenderness over the medial joint line bilaterally.  Calves are soft and nontender.  Patient's right leg does appear to have a gallon and cyst over the right medial hamstring tendon region.    Neurological: He is alert and oriented to person, place, and time.  Skin: Skin is warm and dry.  Psychiatric: He has a normal mood and affect. His behavior is normal. Judgment and thought content normal.    Vital signs in last 24 hours:    Labs:   Estimated body mass index is 32.64 kg/m as calculated from the following:   Height as of this encounter: 5\' 9"  (1.753 m).   Weight as of this encounter: 100.2 kg.   Imaging Review Plain radiographs demonstrate AP, lateral, sunrise, Lutricia Feil, he has moderately advanced tricompartmental osteoarthritis shown of both knees, with it being significantly more in the medial joint line with near bone-on-bone changes seen, as well as peripheral osteophytes and spurring noted in the patellofemoral compartment   Assessment/Plan:  End stage arthritis, Right knee   The patient history, physical examination, clinical judgment of the provider and imaging studies are consistent with end stage degenerative joint disease of the right knee(s) and total knee arthroplasty is deemed medically necessary. The treatment options including medical management, injection therapy arthroscopy and arthroplasty were discussed at length. The risks and benefits of total knee arthroplasty were presented and  reviewed. The risks due to aseptic loosening, infection, stiffness, patella tracking problems, thromboembolic complications and other imponderables were discussed. The patient acknowledged the explanation, agreed to proceed with the plan and consent was signed. Patient is being admitted for inpatient treatment for surgery, pain control, PT, OT, prophylactic antibiotics, VTE prophylaxis, progressive ambulation and ADL's and discharge planning. The patient is planning to be discharged home with home health services     Patient's anticipated LOS is less than 2 midnights, meeting these requirements: - Younger than 26 - Lives within 1 hour of care - Has a competent adult at home to recover with post-op recover - NO history of  - Chronic pain requiring opiods  - Diabetes  - Coronary Artery Disease  - Heart failure  - Heart attack  - Stroke  - DVT/VTE  - Cardiac arrhythmia  - Respiratory Failure/COPD  - Renal failure  - Anemia  - Advanced Liver disease

## 2019-06-08 DIAGNOSIS — R69 Illness, unspecified: Secondary | ICD-10-CM | POA: Diagnosis not present

## 2019-06-08 DIAGNOSIS — M1712 Unilateral primary osteoarthritis, left knee: Secondary | ICD-10-CM | POA: Diagnosis not present

## 2019-06-08 DIAGNOSIS — I10 Essential (primary) hypertension: Secondary | ICD-10-CM | POA: Diagnosis not present

## 2019-06-08 DIAGNOSIS — E785 Hyperlipidemia, unspecified: Secondary | ICD-10-CM | POA: Diagnosis not present

## 2019-06-08 DIAGNOSIS — Z471 Aftercare following joint replacement surgery: Secondary | ICD-10-CM | POA: Diagnosis not present

## 2019-06-08 DIAGNOSIS — Z96651 Presence of right artificial knee joint: Secondary | ICD-10-CM | POA: Diagnosis not present

## 2019-06-08 DIAGNOSIS — J309 Allergic rhinitis, unspecified: Secondary | ICD-10-CM | POA: Diagnosis not present

## 2019-06-08 DIAGNOSIS — E291 Testicular hypofunction: Secondary | ICD-10-CM | POA: Diagnosis not present

## 2019-06-08 DIAGNOSIS — E2740 Unspecified adrenocortical insufficiency: Secondary | ICD-10-CM | POA: Diagnosis not present

## 2019-06-10 ENCOUNTER — Other Ambulatory Visit: Payer: Self-pay

## 2019-06-10 ENCOUNTER — Other Ambulatory Visit (HOSPITAL_COMMUNITY): Payer: Self-pay | Admitting: Orthopedic Surgery

## 2019-06-10 ENCOUNTER — Ambulatory Visit (HOSPITAL_COMMUNITY)
Admission: RE | Admit: 2019-06-10 | Discharge: 2019-06-10 | Disposition: A | Discharge status: Home / Self Care | Payer: Medicare HMO | Source: Ambulatory Visit | Attending: Orthopedic Surgery | Admitting: Orthopedic Surgery

## 2019-06-10 DIAGNOSIS — M79604 Pain in right leg: Secondary | ICD-10-CM | POA: Diagnosis not present

## 2019-06-10 DIAGNOSIS — Z96651 Presence of right artificial knee joint: Secondary | ICD-10-CM | POA: Diagnosis not present

## 2019-06-10 DIAGNOSIS — M1711 Unilateral primary osteoarthritis, right knee: Secondary | ICD-10-CM | POA: Diagnosis not present

## 2019-06-10 DIAGNOSIS — M7989 Other specified soft tissue disorders: Secondary | ICD-10-CM | POA: Diagnosis not present

## 2019-06-10 NOTE — Progress Notes (Signed)
Right lower extremity venous duplex has been completed. Preliminary results can be found in CV Proc through chart review.  Results were given to [facility] at Dr. Damita Dunnings office.  06/10/19 1:36 PM Jeffrey Chapman RVT

## 2019-06-16 DIAGNOSIS — Z96651 Presence of right artificial knee joint: Secondary | ICD-10-CM | POA: Diagnosis not present

## 2019-06-16 DIAGNOSIS — M25561 Pain in right knee: Secondary | ICD-10-CM | POA: Diagnosis not present

## 2019-06-16 DIAGNOSIS — Z9889 Other specified postprocedural states: Secondary | ICD-10-CM | POA: Diagnosis not present

## 2019-06-16 DIAGNOSIS — M1711 Unilateral primary osteoarthritis, right knee: Secondary | ICD-10-CM | POA: Diagnosis not present

## 2019-06-17 DIAGNOSIS — M25561 Pain in right knee: Secondary | ICD-10-CM | POA: Diagnosis not present

## 2019-06-23 DIAGNOSIS — M25561 Pain in right knee: Secondary | ICD-10-CM | POA: Diagnosis not present

## 2019-07-30 DIAGNOSIS — Z471 Aftercare following joint replacement surgery: Secondary | ICD-10-CM | POA: Diagnosis not present

## 2019-07-30 DIAGNOSIS — R69 Illness, unspecified: Secondary | ICD-10-CM | POA: Diagnosis not present

## 2019-07-30 DIAGNOSIS — Z96651 Presence of right artificial knee joint: Secondary | ICD-10-CM | POA: Diagnosis not present

## 2019-07-30 DIAGNOSIS — Z7689 Persons encountering health services in other specified circumstances: Secondary | ICD-10-CM | POA: Diagnosis not present

## 2019-07-30 DIAGNOSIS — M1712 Unilateral primary osteoarthritis, left knee: Secondary | ICD-10-CM | POA: Diagnosis not present

## 2019-07-30 DIAGNOSIS — I10 Essential (primary) hypertension: Secondary | ICD-10-CM | POA: Diagnosis not present

## 2019-08-27 DIAGNOSIS — R0989 Other specified symptoms and signs involving the circulatory and respiratory systems: Secondary | ICD-10-CM | POA: Diagnosis not present

## 2019-08-27 DIAGNOSIS — R0981 Nasal congestion: Secondary | ICD-10-CM | POA: Diagnosis not present

## 2019-08-27 DIAGNOSIS — H9209 Otalgia, unspecified ear: Secondary | ICD-10-CM | POA: Diagnosis not present

## 2019-09-22 DIAGNOSIS — Z125 Encounter for screening for malignant neoplasm of prostate: Secondary | ICD-10-CM | POA: Diagnosis not present

## 2019-09-22 DIAGNOSIS — Z1211 Encounter for screening for malignant neoplasm of colon: Secondary | ICD-10-CM | POA: Diagnosis not present

## 2019-09-22 DIAGNOSIS — Z7289 Other problems related to lifestyle: Secondary | ICD-10-CM | POA: Diagnosis not present

## 2019-09-22 DIAGNOSIS — R7302 Impaired glucose tolerance (oral): Secondary | ICD-10-CM | POA: Diagnosis not present

## 2019-09-22 DIAGNOSIS — R69 Illness, unspecified: Secondary | ICD-10-CM | POA: Diagnosis not present

## 2019-09-22 DIAGNOSIS — R5382 Chronic fatigue, unspecified: Secondary | ICD-10-CM | POA: Diagnosis not present

## 2019-09-22 DIAGNOSIS — Z136 Encounter for screening for cardiovascular disorders: Secondary | ICD-10-CM | POA: Diagnosis not present

## 2019-09-22 DIAGNOSIS — I10 Essential (primary) hypertension: Secondary | ICD-10-CM | POA: Diagnosis not present

## 2019-09-22 DIAGNOSIS — Z96651 Presence of right artificial knee joint: Secondary | ICD-10-CM | POA: Diagnosis not present

## 2019-09-22 DIAGNOSIS — Z13220 Encounter for screening for lipoid disorders: Secondary | ICD-10-CM | POA: Diagnosis not present

## 2019-09-22 DIAGNOSIS — R5383 Other fatigue: Secondary | ICD-10-CM | POA: Diagnosis not present

## 2019-09-22 DIAGNOSIS — Z0000 Encounter for general adult medical examination without abnormal findings: Secondary | ICD-10-CM | POA: Diagnosis not present

## 2019-09-29 DIAGNOSIS — Z1211 Encounter for screening for malignant neoplasm of colon: Secondary | ICD-10-CM | POA: Diagnosis not present

## 2019-10-26 DIAGNOSIS — M1990 Unspecified osteoarthritis, unspecified site: Secondary | ICD-10-CM | POA: Diagnosis not present

## 2019-10-26 DIAGNOSIS — D45 Polycythemia vera: Secondary | ICD-10-CM | POA: Diagnosis not present

## 2019-10-26 DIAGNOSIS — I10 Essential (primary) hypertension: Secondary | ICD-10-CM | POA: Diagnosis not present

## 2019-10-26 DIAGNOSIS — E785 Hyperlipidemia, unspecified: Secondary | ICD-10-CM | POA: Diagnosis not present

## 2019-10-26 DIAGNOSIS — R69 Illness, unspecified: Secondary | ICD-10-CM | POA: Diagnosis not present

## 2019-10-26 DIAGNOSIS — E2740 Unspecified adrenocortical insufficiency: Secondary | ICD-10-CM | POA: Diagnosis not present

## 2019-10-26 DIAGNOSIS — D751 Secondary polycythemia: Secondary | ICD-10-CM | POA: Diagnosis not present

## 2019-10-26 DIAGNOSIS — R799 Abnormal finding of blood chemistry, unspecified: Secondary | ICD-10-CM | POA: Diagnosis not present

## 2019-10-28 DIAGNOSIS — R799 Abnormal finding of blood chemistry, unspecified: Secondary | ICD-10-CM | POA: Diagnosis not present

## 2019-10-28 DIAGNOSIS — R69 Illness, unspecified: Secondary | ICD-10-CM | POA: Diagnosis not present

## 2019-10-28 DIAGNOSIS — D751 Secondary polycythemia: Secondary | ICD-10-CM | POA: Diagnosis not present

## 2019-10-30 DIAGNOSIS — R69 Illness, unspecified: Secondary | ICD-10-CM | POA: Diagnosis not present

## 2019-10-30 DIAGNOSIS — R799 Abnormal finding of blood chemistry, unspecified: Secondary | ICD-10-CM | POA: Diagnosis not present

## 2019-10-30 DIAGNOSIS — D45 Polycythemia vera: Secondary | ICD-10-CM | POA: Diagnosis not present

## 2019-11-02 DIAGNOSIS — D751 Secondary polycythemia: Secondary | ICD-10-CM | POA: Diagnosis not present

## 2019-11-04 ENCOUNTER — Encounter: Payer: Self-pay | Admitting: Neurology

## 2019-11-04 ENCOUNTER — Other Ambulatory Visit: Payer: Self-pay | Admitting: Neurology

## 2019-11-04 DIAGNOSIS — R799 Abnormal finding of blood chemistry, unspecified: Secondary | ICD-10-CM | POA: Diagnosis not present

## 2019-11-04 DIAGNOSIS — D45 Polycythemia vera: Secondary | ICD-10-CM | POA: Diagnosis not present

## 2019-11-04 DIAGNOSIS — R69 Illness, unspecified: Secondary | ICD-10-CM | POA: Diagnosis not present

## 2019-11-04 DIAGNOSIS — D751 Secondary polycythemia: Secondary | ICD-10-CM | POA: Diagnosis not present

## 2019-11-05 ENCOUNTER — Ambulatory Visit (INDEPENDENT_AMBULATORY_CARE_PROVIDER_SITE_OTHER): Payer: Medicare HMO | Admitting: Neurology

## 2019-11-05 ENCOUNTER — Other Ambulatory Visit: Payer: Self-pay

## 2019-11-05 ENCOUNTER — Encounter: Payer: Self-pay | Admitting: Neurology

## 2019-11-05 VITALS — BP 130/82 | HR 77 | Temp 97.1°F | Ht 69.0 in | Wt 220.0 lb

## 2019-11-05 DIAGNOSIS — R0683 Snoring: Secondary | ICD-10-CM | POA: Insufficient documentation

## 2019-11-05 DIAGNOSIS — D751 Secondary polycythemia: Secondary | ICD-10-CM | POA: Diagnosis not present

## 2019-11-05 DIAGNOSIS — F5104 Psychophysiologic insomnia: Secondary | ICD-10-CM | POA: Diagnosis not present

## 2019-11-05 DIAGNOSIS — G4719 Other hypersomnia: Secondary | ICD-10-CM | POA: Insufficient documentation

## 2019-11-05 DIAGNOSIS — R69 Illness, unspecified: Secondary | ICD-10-CM | POA: Diagnosis not present

## 2019-11-05 DIAGNOSIS — F32A Depression, unspecified: Secondary | ICD-10-CM

## 2019-11-05 DIAGNOSIS — F329 Major depressive disorder, single episode, unspecified: Secondary | ICD-10-CM | POA: Diagnosis not present

## 2019-11-05 DIAGNOSIS — G4700 Insomnia, unspecified: Secondary | ICD-10-CM | POA: Insufficient documentation

## 2019-11-05 NOTE — Patient Instructions (Addendum)
Polycythemia Vera  Polycythemia vera (PV), or myeloproliferative disease, is a form of blood cancer in which the bone marrow makes too many (overproduces) red blood cells. The bone marrow may also make too many clotting cells (platelets) and white blood cells. Bone marrow is the spongy center of bones where blood cells are produced. Sometimes, there may be an overproduction of blood cells in the liver and spleen, causing those organs to become enlarged. Additionally, people who have PV are at a higher risk for stroke or heart attack because their blood may clot more easily. PV is a long-term (chronic)disease. What are the causes? Almost all people who have PV have an abnormal gene (genetic mutation) that causes changes in the way that the bone marrow makes blood cells. This gene, which is called JAK2, is not passed along from parent to child (is not hereditary). It is not known what triggers the genetic mutation that causes the body to produce too many red blood cells. What increases the risk? You are more likely to develop this condition if you are:  Male.  49 years of age or older. What are the signs or symptoms? You may not have any symptoms in the early stage of PV. When symptoms develop, they may include:  Shortness of breath.  Dizziness.  Hot and flushed skin.  Itchy skin.  Sweats, especially night sweats.  Headache.  Tiredness.  Ringing in the ears.  Blurred vision or blind spots.  Bone pain.  Weight loss.  Fever.  Blood-tinged vomit or stool. How is this diagnosed? This condition may be diagnosed during a routine physical exam and a blood test called a complete blood count (CBC). Your health care provider also may suspect PV if you have symptoms. During the physical exam, your provider may find that you have an enlarged liver or spleen. You may also have tests to confirm the diagnosis. These may include:  A procedure to remove a sample of bone marrow for testing  (bone marrow biopsy).  Blood tests to check for: ? The JAK2 gene. ? Low levels of a hormone that helps to regulate blood production (erythropoietin). How is this treated? There is no cure for PV, but treatment can help to control the disease. There are several types of treatment. No single treatment works for everyone. You will need to work with a blood cancer specialist (hematologist) to find the treatment that is best for you. This condition may be treated by:  Periodically having some blood removed with a needle (drawn) to lower the number of red blood cells (phlebotomy).  Taking medicine. Your health care provider may recommend: ? Low-dose aspirin to lower your risk for blood clots. ? A medicine to reduce red blood cell production. ? A medicine to lower the number of red blood cells. ? A medicine that slows down the effects of JAK2 gene. ? Other medicines to treat symptoms such as itching. Follow these instructions at home:   Take over-the-counter and prescription medicines only as told by your health care provider.  Return to your normal activities as told by your health care provider. Ask your health care provider what activities are safe for you.  Do regular exercise as told by your health care provider.  Check your hands and feet regularly for any sores that do not heal.  Do not use any products that contain nicotine or tobacco, such as cigarettes, e-cigarettes, and chewing tobacco. If you need help quitting, ask your health care provider.  Keep all  follow-up visits as told by your health care provider. This is important. Contact a health care provider if:  You have side effects from your medicines.  Your symptoms change or get worse at home.  You have blood in your stool or you vomit blood. Get help right away if:  You have sudden and severe pain in your abdomen.  You have chest pain or difficulty breathing.  You have signs of stroke, such as: ? Sudden  numbness. ? Weakness of your face or arm. ? Confusion. ? Difficulty speaking or understanding speech. These symptoms may represent a serious problem that is an emergency. Do not wait to see if the symptoms will go away. Get medical help right away. Call your local emergency services (911 in the [place] Do not drive yourself to the hospital. Summary  Polycythemia vera is a form of blood cancer in which the bone marrow makes too many red blood cells.  People who have polycythemia vera are at a higher risk for stroke or heart attack because their blood may clot more easily.  The disease is caused by a genetic mutation that causes changes in the way that the bone marrow makes blood cells.  It is diagnosed with blood tests and a bone marrow biopsy.  There is no cure for PV, but treatment can help to control the disease. It is treated by periodically having some blood removed and by taking medicines. This information is not intended to replace advice given to you by your health care provider. Make sure you discuss any questions you have with your health care provider. Document Revised: 06/12/2018 Document Reviewed: 06/12/2018 Elsevier Patient Education  [employer].  The patient had reportedly COVIF infection in mid- January.  He tested positive for Covid at the time.  He has not experienced a worsening of his condition neither insomnia nor polycythemia in response.  He recovered without lasting symptoms.  I think that the main cause for his sleep interruption may be more likely depression or anxiety and a high level of anxiety usually leads to frequent arousals without a physical cause meaning that there is no bathroom urge, no pain no gasping or coughing.  He also has an organic cause in the form of nocturia.  In order to make sure that it is not obstructive sleep apnea or sleep-related hypoxemia that causes his polycythemia I will order an attended sleep study in the sleep lab.  It would be  most valuable to have an attended sleep study as he sees a sleep stages and also the presence of arousals.  My experience with at night however is that they will insist on a home sleep test and we may have to do this first as a screening test and then go from there.  He has discontinued a lot of the medications that were supposed to help him with sleep and anxiety-depression at this point.  I think he would be best off resuming Seroquel at 25 mg using a pill cutter to cut the current dose in half.  I would also like him to address with his primary physician that Wellbutrin in the morning should be started at 150 mg extended release to not affect his blood pressure.  He is not a smoker and he does not drink quantities of alcohol that would be medically concerning.  We will follow up after the sleep studies have been obtained he should continue vitamin D3, zinc gluconate also is a recovering Covid patient, the Provigil at  088 mg is certainly a good idea magnesium, lisinopril, ibuprofen as needed vitamin C and Flonase.  I also noted that he carries a diagnosis of ADD or ADHD adult residual type.  It may be worth  considering for his psychologist to start him on a Ritalin to help with his daytime fatigue concentration and endurance.

## 2019-11-05 NOTE — Progress Notes (Signed)
SLEEP MEDICINE CLINIC    Provider:  Larey Seat, MD  Primary Care Physician:  [place] [id number]     Referring Provider: Dr. Huel Cote .         Chief Complaint according to patient   Patient presents with:    . New Patient (Initial Visit)     pt states that he has struggles with staying asleep. he was started on seroquel and he states that he feels groggy the next day so had to stop this. he has struggled with insomnia in the past       HISTORY OF PRESENT ILLNESS:  Jeffrey Chapman is a 59 year old  Caucasian male patient seen here upon referral on 11/05/2019 from [facility]-. I have the pleasure of seeing Jeffrey Chapman today, a right -handed White or Caucasian male with a possible sleep disorder.  He  has a past medical history of ADD (attention deficit disorder) (2015), Anxiety, Arthritis, ED (erectile dysfunction) (2015), Hepatitis B (1977), High blood hemoglobin A2 ([place]), Hyperlipemia (2015), Hypertension, Hypoaldosteronism ([place]) (2015), Hypogonadism, testicular (2015), and Seasonal allergies..  Jeffrey Chapman referral is based not so much on insomnia or poor sleep or excessive daytime sleepiness which she also endorsed with an Epworth score of 15 out of 24.  The main concern of his primary physicians is that he has polycythemia vera and very high levels of red blood cells.  One of the questions is could nocturnal hypoxemia related or unrelated to apnea caused this increase in red blood cells.   The patient has been treated for depression for a long time he was taking Wellbutrin in the morning and he had been taking Seroquel for insomnia at night.  He also tookZanaflex which is drowsiness provoking and was on Neurontin which also caused drowsiness.  He is taking modafinil still in daytime also some of the more drowsy making medications have been discontinued.  He is taking the generic form 100 mg in the morning.  He is officially on  Seroquel 50 mg at night which is not a high dose- he D/C the medication because of drowsiness.    The patient never had a sleep study before but uses a pulseoximeter at home. Sleep relevant medical history: polycythemia vera, depression on and off, Insomnia related to depression , high fatigue due to depression.  Nocturia 2-3, no neck or ENT surgery , trauma.    Family medical /sleep history: brother has insomnia, depression-  Cyclic. Nobody with OSA, but insomnia.     Social history:  Patient is working as part Contractor,  currently on short term disability- and lives in a household with 3 persons, his  girlfriend and  3 year old daughter )The patient currently works daytime . Pets are present. Dog. Tobacco use_ none .  ETOH use ; Week ends only 6 pack , Caffeine intake in form of Coffee( in am ) Soda( none) Tea ( iced tea- lunch sometimes dinner.) or energy drinks. Regular exercise in form of knee PT .   Hobbies: strength raining - his home GYM.     Sleep habits are as follows: the patient does intermittent fasting 8:16 hours.  The patient's dinner time is between 8 PM.  The patient goes to bed at 11 PM and continues to sleep for 2 hours, wakes for 2-3 bathroom breaks, the first time at 1-2 AM.  Can't easily go back to sleep. Takes melatonin only  now. Often has 3 periods of 2 hours of sleep.  The preferred sleep position is prone and on his side, with the support of 2 pillows. Dreams are reportedly  frequent/vivid.   8 AM is the usual rise time. The patient wakes up spontaneously and with an alarm.  He reports not feeling refreshed or restored in AM, with symptoms such as dry mouth, but no morning headaches, and residual fatigue- he can sleep again after 11 AM again .  Naps are taken frequently, not scheduled - lasting 20- 30 minutes and are more refreshing than nocturnal sleep.    Review of Systems: Out of a complete 14 system review, the patient complains of only the following symptoms,  and all other reviewed systems are negative.:  Fatigue, sleepiness , snoring, fragmented sleep, Insomnia - early morning awakening, and interrupted sleep, nocturia- not re;ated to pain.   Untreated depression and insomnia at this time.    How likely are you to doze in the following situations: 0 = not likely, 1 = slight chance, 2 = moderate chance, 3 = high chance   Sitting and Reading? Watching Television? Sitting inactive in a public place (theater or meeting)? As a passenger in a car for an hour without a break? Lying down in the afternoon when circumstances permit? Sitting and talking to someone? Sitting quietly after lunch without alcohol? In a car, while stopped for a few minutes in traffic?   Total = 15/ 24 points   FSS endorsed at N?A-/ 63 points.   Social History   Socioeconomic History  . Marital status: Divorced    Spouse name: Not on file  . Number of children: Not on file  . Years of education: Not on file  . Highest education level: Not on file  Occupational History  . Not on file  Tobacco Use  . Smoking status: Never Smoker  . Smokeless tobacco: Never Used  Substance and Sexual Activity  . Alcohol use: Yes    Alcohol/week: 1.0 standard drinks    Types: 1 Shots of liquor per week  . Drug use: No  . Sexual activity: Yes  Other Topics Concern  . Not on file  Social History Narrative  . Not on file   Social Determinants of Health   Financial Resource Strain:   . Difficulty of Paying Living Expenses:   Food Insecurity:   . Worried About Charity fundraiser in the Last Year:   . Arboriculturist in the Last Year:   Transportation Needs:   . Film/video editor (Medical):   Marland Kitchen Lack of Transportation (Non-Medical):   Physical Activity:   . Days of Exercise per Week:   . Minutes of Exercise per Session:   Stress:   . Feeling of Stress :   Social Connections:   . Frequency of Communication with Friends and Family:   . Frequency of Social Gatherings  with Friends and Family:   . Attends Religious Services:   . Active Member of Clubs or Organizations:   . Attends Archivist Meetings:   Marland Kitchen Marital Status:     Family History  Problem Relation Age of Onset  . Asthma Mother   . COPD Father   . Cancer Brother     Past Medical History:  Diagnosis Date  . ADD (attention deficit disorder) 2015  . Anxiety   . Arthritis   . ED (erectile dysfunction) 2015  . Hepatitis B 1977  . High blood hemoglobin  A2 ([place])   . Hyperlipemia 2015  . Hypertension   . Hypoaldosteronism ([place]) 2015  . Hypogonadism, testicular 2015  . Seasonal allergies     Past Surgical History:  Procedure Laterality Date  . KNEE ARTHROSCOPY W/ MENISCAL REPAIR Left   . 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 ascorbic acid (VITAMIN C) 500 MG tablet Take by mouth.    Marland Kitchen aspirin EC 81 MG tablet Take 1 tablet (81 mg total) by mouth 2 (two) times daily. 60 tablet 0  . Cholecalciferol (DIALYVITE VITAMIN D 5000 PO) Take 5,000 Units by mouth daily.    Marland Kitchen lisinopril (PRINIVIL,ZESTRIL) 20 MG tablet Take 20 mg by mouth daily.     . magnesium gluconate (MAGONATE) 500 MG tablet Take 500 mg by mouth daily.    . modafinil (PROVIGIL) 100 MG tablet Take by mouth.    . zinc gluconate 50 MG tablet Take by mouth.    . QUEtiapine (SEROQUEL) 50 MG tablet Take by mouth.     No current facility-administered medications on file prior to visit.    Allergies  Allergen Reactions  . Flexeril [Cyclobenzaprine] Anaphylaxis  . Lactose Intolerance (Gi)     Dairy-makes him wheeze  . Wheat Bran     Makes him wheeze    Physical exam:  Today's Vitals   11/05/19 0953  BP: 130/82  Pulse: 77  Temp: (!) 97.1 F (36.2 C)  Weight: 220 lb (99.8 kg)  Height:  5\' 9"  (1.753 m)   Body mass index is 32.49 kg/m.   Wt Readings from Last 3 Encounters:  11/05/19 220 lb (99.8 kg)  06/01/19 221 lb (100.2 kg)  05/28/19 221 lb (100.2 kg)     Ht Readings from Last 3 Encounters:  11/05/19 5\' 9"  (1.753 m)  06/01/19 5\' 9"  (1.753 m)  05/28/19 5\' 9"  (1.753 m)      General: The patient is awake, alert and appears not in acute distress. The patient is well groomed. Head: Normocephalic, atraumatic. Neck is supple. Mallampati 3, small mouth.  neck circumference:18 inches . Nasal airflow  patent.  Retrognathia is not seen.  Dental status: biological teeth.  Cardiovascular:  Regular rate and cardiac rhythm by pulse,  without distended neck veins. Respiratory: Lungs are clear to auscultation.  Skin:   flushed, almost pink, Trunk: The patient's posture is erect.   Neurologic exam : The patient is awake and alert, oriented to place and time.   Memory subjective described as intact.  Attention span & concentration ability appears normal.  Speech is fluent,  without  dysarthria,  He has clearly dysphonia .  Mood and affect are anxious, appropriate.   Cranial nerves: no loss of smell or taste reported  Pupils are equal and briskly reactive to light. Funduscopic exam deferred.  Extraocular movements in vertical and horizontal planes were intact and without nystagmus.  No Diplopia. Visual fields by finger perimetry are intact. Hearing was intact to soft voice and finger rubbing.    Facial sensation intact to fine touch.  Facial motor strength is symmetric and tongue and uvula move midline.  Neck ROM : rotation, tilt and flexion extension were normal for age and shoulder shrug was symmetrical.    Motor exam:  Symmetric bulk, tone and ROM.  Normal tone without cog wheeling, symmetric grip strength .   Sensory:  Fine touch, pinprick and vibration were tested  and  normal.  Proprioception tested in the upper extremities was normal.   Coordination: Rapid  alternating movements in the fingers/hands were of normal speed.  The Finger-to-nose maneuver was intact without evidence of ataxia, dysmetria or tremor.   Gait and station: Patient could rise unassisted from a seated position, walked without assistive device. He has had a recent knee replacement.  Toe and heel walk were deferred.  Deep tendon reflexes: in the  upper and lower extremities are symmetric and intact.  Babinski response was deferred.           After spending a total time of  45 minutes face to face and additional time for physical and neurologic examination, review of laboratory studies,  personal review of imaging studies, reports and results of other testing and review of referral information / records as far as provided in visit, I have established the following assessments:  The patient had reportedly COVIF infection in mid- January.  He tested positive for Covid at the time.  He has not experienced a worsening of his condition neither insomnia nor polycythemia in response.  He recovered without lasting symptoms.  I think that the main cause for his sleep interruption may be more likely depression or anxiety and a high level of anxiety usually leads to frequent arousals without a physical cause meaning that there is no bathroom urge, no pain no gasping or coughing.  He also has an organic cause in the form of nocturia.  In order to make sure that it is not obstructive sleep apnea or sleep-related hypoxemia that causes his polycythemia I will order an attended sleep study in the sleep lab.  It would be most valuable to have an attended sleep study as he sees a sleep stages and also the presence of arousals.  My experience with at night however is that they will insist on a home sleep test and we may have to do this first as a screening test and then go from there.  He has discontinued a lot of the medications that were supposed to help him with sleep and anxiety-depression at this  point.  I think he would be best off resuming Seroquel at 25 mg using a pill cutter to cut the current dose in half.  I would also like him to address with his primary physician that Wellbutrin in the morning should be started at 150 mg extended release to not affect his blood pressure.  He is not a smoker and he does not drink quantities of alcohol that would be medically concerning.  We will follow up after the sleep studies have been obtained he should continue vitamin D3, zinc gluconate also is a recovering Covid patient, the Provigil at [id number] mg is certainly a good idea magnesium, lisinopril, ibuprofen as needed vitamin C and Flonase.  I also noted that he carries a diagnosis of ADD or ADHD adult residual type.  It may be worth  considering for his psychologist to start him on a Ritalin to help with his daytime fatigue concentration and endurance.      My Plan is to proceed with:  1) Attended sleep study- see above arguments. Erythythemia vera. Hypoxemia to be ruled out. This can be caused by sleep apnea, hypopnea and may be related to sleep stages.   2) treat anxiety with a professional.  3) insomnia is most  likely anxiety related.   I would like to thank Dr. Oswald Hillock, MD  [place] and  for allowing me to meet with and to take care of this pleasant patient.    I plan to follow up either personally or through our NP within 2-3 month.   CC: I will share my notes with PCP  Electronically signed by: Larey Seat, MD 11/05/2019 10:18 AM  [facility] Board certified by The [employer] of Sleep Medicine and Diplomate of the [employer] of Sleep Medicine. Board certified In Neurology through the [place], Fellow of the [employer] of Neurology. Medical Director of [facility].

## 2019-11-10 ENCOUNTER — Telehealth: Payer: Self-pay | Admitting: Neurology

## 2019-11-10 NOTE — Telephone Encounter (Signed)
Called the patient back as I was reviewing the notes I don't see where Dr Brett Fairy discussed ordering this medication. It appeared she would like him to address with his primary physician that Wellbutrin in the morning should be started at 150 mg extended release to not affect his blood pressure.  Pt verbalized understanding and stated he must have misunderstood and will reach out to PCP. Patient was appreciative for the call back.

## 2019-11-10 NOTE — Telephone Encounter (Signed)
Pt called to check on the wellbutrin pt states his pharmacy has not received prescription

## 2019-11-11 DIAGNOSIS — G4719 Other hypersomnia: Secondary | ICD-10-CM | POA: Diagnosis not present

## 2019-11-11 DIAGNOSIS — D751 Secondary polycythemia: Secondary | ICD-10-CM | POA: Diagnosis not present

## 2019-11-11 DIAGNOSIS — R0683 Snoring: Secondary | ICD-10-CM | POA: Diagnosis not present

## 2019-11-30 ENCOUNTER — Ambulatory Visit (INDEPENDENT_AMBULATORY_CARE_PROVIDER_SITE_OTHER): Payer: Medicare HMO | Admitting: Neurology

## 2019-11-30 DIAGNOSIS — D751 Secondary polycythemia: Secondary | ICD-10-CM

## 2019-11-30 DIAGNOSIS — G4733 Obstructive sleep apnea (adult) (pediatric): Secondary | ICD-10-CM | POA: Diagnosis not present

## 2019-11-30 DIAGNOSIS — F5104 Psychophysiologic insomnia: Secondary | ICD-10-CM

## 2019-11-30 DIAGNOSIS — R0683 Snoring: Secondary | ICD-10-CM

## 2019-12-10 ENCOUNTER — Telehealth: Payer: Self-pay | Admitting: Neurology

## 2019-12-10 ENCOUNTER — Encounter: Payer: Self-pay | Admitting: Neurology

## 2019-12-10 DIAGNOSIS — G4733 Obstructive sleep apnea (adult) (pediatric): Secondary | ICD-10-CM | POA: Insufficient documentation

## 2019-12-10 NOTE — Telephone Encounter (Signed)
-----   Message from Larey Seat, MD sent at 12/10/2019 12:43 PM EDT ----- Summary & Diagnosis:   The patient's HST appreciated only mild sleep apnea(OSA) , with  an AHI of 9.4/h and REM sleep accentuation to an AHI of 18/h.  there was no evidence of nocturnal hypoxia, and his heartrate  trended to bradycardia.   Recommendations:    This REM dependent form of Apnea should be treated by Positive  Airway Pressure, an autotitration CPAP device with a setting from  5-15 cm water 2 cm EPR should be provided, using heated  humidification and mask of patient's choice and comfort.  RV: the patient will follow up with NP in 2-3 month.   Interpreting Physician: Asencion Partridge Dohmeier,MD

## 2019-12-10 NOTE — Progress Notes (Signed)
Summary & Diagnosis:   The patient's HST appreciated only mild sleep apnea(OSA) , with  an AHI of 9.4/h and REM sleep accentuation to an AHI of 18/h.  there was no evidence of nocturnal hypoxia, and his heartrate  trended to bradycardia.   Recommendations:    This REM dependent form of Apnea should be treated by Positive  Airway Pressure, an autotitration CPAP device with a setting from  5-15 cm water 2 cm EPR should be provided, using heated  humidification and mask of patient's choice and comfort.  RV: the patient will follow up with NP in 2-3 month.   Interpreting Physician: Asencion Partridge Braxton Vantrease,MD

## 2019-12-10 NOTE — Telephone Encounter (Signed)
I called pt. I advised pt that Dr. Brett Fairy reviewed their sleep study results and found that pt has sleep apnea. Dr. Brett Fairy recommends that pt starts auto CPAP. I reviewed PAP compliance expectations with the pt. Pt is agreeable to starting a CPAP. I advised pt that an order will be sent to a DME, [facility], and [facility] will call the pt within about one week after they file with the pt's insurance. [facility] will show the pt how to use the machine, fit for masks, and troubleshoot the CPAP if needed. A follow up appt was made for insurance purposes with Dr. Brett Fairy on June 21,2021 at 10:30 am. Pt verbalized understanding to arrive 15 minutes early and bring their CPAP. A letter with all of this information in it will be mailed to the pt as a reminder. I verified with the pt that the address we have on file is correct. Pt verbalized understanding of results. Pt had no questions at this time but was encouraged to call back if questions arise. I have sent the order to [facility] and have received confirmation that they have received the order.

## 2019-12-10 NOTE — Procedures (Signed)
Patient Information     First Name: Jeffrey Last Name: Chapman ID: [id number]  Birth Date: [birth year] Age: 59 Gender: Male  Referring provider: Scotty Court, DO BMI: 32.7 (W=220 lb, H=5' 9'')  Neck Circ.:  18 '' Epworth:  15/24   Sleep Study Information    Study Date: Nov 30, 2019 S/H/A Version: 001.001.001.001 / 4.1.1528 / 47  History:    Jeffrey Chapman is a 59 year old male patient and was seen on 11/05/2019, upon referral from [facility].  I have the pleasure of seeing him today, a right -handed White or Caucasian male with a medical history of ADD (attention deficit disorder) (2015), ED (erectile dysfunction) (2015), Hepatitis B (1977), High blood hemoglobin A2 ([place]), Hyperlipemia (2015), Hypertension, Hypoaldosteronism ([place]) (2015), Hypogonadism, testicular (2015), and Seasonal allergies.   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  One of the questions is could nocturnal hypoxemia related or unrelated to apnea caused this increase in red blood cells.     Summary & Diagnosis:    The patient's HST appreciated only mild sleep apnea(OSA) , with an AHI of 9.4/h and REM sleep accentuation to an AHI of 18/h. there was no evidence of nocturnal hypoxia, and his heartrate trended to bradycardia.   Recommendations:     This REM dependent form of Apnea should be treated by Positive Airway Pressure, an autotitration CPAP device with a setting from 5-15 cm water 2 cm EPR should be provided, using heated humidification and mask of patient's choice and comfort.  RV: the patient will follow up with NP in 2-3 month.   Interpreting Physician: Jeffrey Chapman              Sleep Summary  Oxygen Saturation Statistics   Start Study Time: End Study Time: Total Recording Time:  10:23:40 PM   5:44:02 AM 7 h, 20 min  Total Sleep Time % REM of Sleep Time:  5 h, 50 min 18.9    Mean: 93 Minimum: 87 Maximum: 98  Mean of  Desaturations Nadirs (%):   91  Oxygen Desaturation. %: 4-9 10-20 >20 Total  Events Number Total  19 100.0  0 0.0  0 0.0  19 100.0  Oxygen Saturation: <90 <=88 <85 <80 <70  Duration (minutes): Sleep % 1.2 0.5 0.3 0.1 0.0 0.0 0.0 0.0 0.0 0.0     Respiratory Indices      Total Events REM NREM All Night  pRDI:  126  pAHI:  54 ODI:  19  pAHIc:  0  % CSR: 0.0 29.2 18.2 9.1 0.0 20.2 7.3 1.9 0.0 21.9 9.4 3.3 0.0       Pulse Rate Statistics during Sleep (BPM)      Mean: 64 Minimum: 40 Maximum: 96    Indices are calculated using technically valid sleep time of 5 h, 45 min. Central-Indices are calculated using technically valid sleep time of 5  h, 36 min. pRDI/pAHI are calculated using 02 desaturations ? 3% Sit Body Position Statistics  Position Supine Prone Right Left Non-Supine  Sleep (min) 38.0 72.5 240.4 0.0 312.9  Sleep % 10.8 20.7 68.5 0.0 89.2  pRDI 20.6 15.9 23.9 N/A 22.0  pAHI 17.2 10.0 8.0 N/A 8.5  ODI 3.4 5.9 2.5 N/A 3.3     Snoring Statistics Snoring Level (dB) >40 >50 >60 >70 >80 >Threshold (45)  Sleep (min) 116.8 39.2 2.6 0.0 0.0 67.4  Sleep %  33.3 11.2 0.7 0.0 0.0 19.2    Mean: 43 dB Sleep Stages Chart               pAHI=9.4                           Mild              Moderate                    Severe                                                 5              15                    30  * Reference values are according to AASM guidelines

## 2019-12-10 NOTE — Addendum Note (Signed)
Addended by: Larey Seat on: 12/10/2019 12:43 PM   Modules accepted: Orders

## 2019-12-16 DIAGNOSIS — E782 Mixed hyperlipidemia: Secondary | ICD-10-CM | POA: Diagnosis not present

## 2019-12-16 DIAGNOSIS — D751 Secondary polycythemia: Secondary | ICD-10-CM | POA: Diagnosis not present

## 2019-12-16 DIAGNOSIS — E291 Testicular hypofunction: Secondary | ICD-10-CM | POA: Diagnosis not present

## 2019-12-16 NOTE — Telephone Encounter (Signed)
4/22 received a notification that [facility] would not accept the patient's insurance. I have forwarded the order to [facility] for the patient. Attempted to call to advise the patient of this and there was no answer. Also send a mychart message to inform.

## 2019-12-23 DIAGNOSIS — G4733 Obstructive sleep apnea (adult) (pediatric): Secondary | ICD-10-CM | POA: Diagnosis not present

## 2020-01-12 DIAGNOSIS — G4733 Obstructive sleep apnea (adult) (pediatric): Secondary | ICD-10-CM | POA: Diagnosis not present

## 2020-01-14 ENCOUNTER — Telehealth: Payer: Self-pay | Admitting: Neurology

## 2020-01-14 NOTE — Telephone Encounter (Signed)
Called the patient because the papers that I received from him was a denial for modafinil. Patient states that he was advised by the [facility] that we would need to complete this since we diagnosed the sleep apnea. Advised the patient that since Dr Brett Fairy is not the ordering MD of this medication I unfortunately can not complete this information for him. Pt requested I called that practice. Advised I would but encouraged him to follow up behind me. I called the practice [phone] A nurse answered the phone and she completely understood this and stated she would pass this information along.

## 2020-01-19 DIAGNOSIS — D751 Secondary polycythemia: Secondary | ICD-10-CM | POA: Diagnosis not present

## 2020-01-19 DIAGNOSIS — G4700 Insomnia, unspecified: Secondary | ICD-10-CM | POA: Diagnosis not present

## 2020-01-19 DIAGNOSIS — R718 Other abnormality of red blood cells: Secondary | ICD-10-CM | POA: Diagnosis not present

## 2020-01-19 DIAGNOSIS — G4733 Obstructive sleep apnea (adult) (pediatric): Secondary | ICD-10-CM | POA: Diagnosis not present

## 2020-01-19 DIAGNOSIS — G4719 Other hypersomnia: Secondary | ICD-10-CM | POA: Diagnosis not present

## 2020-01-23 DIAGNOSIS — G4733 Obstructive sleep apnea (adult) (pediatric): Secondary | ICD-10-CM | POA: Diagnosis not present

## 2020-02-08 ENCOUNTER — Ambulatory Visit: Payer: Self-pay | Admitting: Neurology

## 2020-02-19 DIAGNOSIS — H5213 Myopia, bilateral: Secondary | ICD-10-CM | POA: Diagnosis not present

## 2020-02-27 DIAGNOSIS — Q245 Malformation of coronary vessels: Secondary | ICD-10-CM | POA: Diagnosis not present

## 2020-02-27 DIAGNOSIS — Z0100 Encounter for examination of eyes and vision without abnormal findings: Secondary | ICD-10-CM | POA: Diagnosis not present

## 2020-02-27 DIAGNOSIS — E7849 Other hyperlipidemia: Secondary | ICD-10-CM | POA: Diagnosis not present

## 2020-03-20 DIAGNOSIS — G4733 Obstructive sleep apnea (adult) (pediatric): Secondary | ICD-10-CM | POA: Diagnosis not present

## 2020-03-29 ENCOUNTER — Encounter: Payer: Self-pay | Admitting: Neurology

## 2020-03-29 ENCOUNTER — Ambulatory Visit: Payer: Self-pay | Admitting: Neurology

## 2020-04-20 DIAGNOSIS — G4733 Obstructive sleep apnea (adult) (pediatric): Secondary | ICD-10-CM | POA: Diagnosis not present

## 2020-04-20 DIAGNOSIS — I10 Essential (primary) hypertension: Secondary | ICD-10-CM | POA: Diagnosis not present

## 2020-04-20 DIAGNOSIS — E291 Testicular hypofunction: Secondary | ICD-10-CM | POA: Diagnosis not present

## 2020-04-20 DIAGNOSIS — D751 Secondary polycythemia: Secondary | ICD-10-CM | POA: Diagnosis not present

## 2020-04-24 DIAGNOSIS — G4733 Obstructive sleep apnea (adult) (pediatric): Secondary | ICD-10-CM | POA: Diagnosis not present

## 2020-04-27 DIAGNOSIS — R69 Illness, unspecified: Secondary | ICD-10-CM | POA: Diagnosis not present

## 2020-04-27 DIAGNOSIS — M1712 Unilateral primary osteoarthritis, left knee: Secondary | ICD-10-CM | POA: Diagnosis not present

## 2020-05-18 DIAGNOSIS — M25762 Osteophyte, left knee: Secondary | ICD-10-CM | POA: Diagnosis not present

## 2020-05-18 DIAGNOSIS — M1712 Unilateral primary osteoarthritis, left knee: Secondary | ICD-10-CM | POA: Diagnosis not present

## 2020-05-18 DIAGNOSIS — M222X2 Patellofemoral disorders, left knee: Secondary | ICD-10-CM | POA: Diagnosis not present

## 2020-05-18 DIAGNOSIS — M25562 Pain in left knee: Secondary | ICD-10-CM | POA: Diagnosis not present

## 2020-05-24 DIAGNOSIS — G4733 Obstructive sleep apnea (adult) (pediatric): Secondary | ICD-10-CM | POA: Diagnosis not present

## 2020-05-25 DIAGNOSIS — M1712 Unilateral primary osteoarthritis, left knee: Secondary | ICD-10-CM | POA: Diagnosis not present

## 2020-05-31 DIAGNOSIS — M1712 Unilateral primary osteoarthritis, left knee: Secondary | ICD-10-CM | POA: Diagnosis not present

## 2020-06-08 DIAGNOSIS — M1712 Unilateral primary osteoarthritis, left knee: Secondary | ICD-10-CM | POA: Diagnosis not present

## 2020-06-14 DIAGNOSIS — M1712 Unilateral primary osteoarthritis, left knee: Secondary | ICD-10-CM | POA: Diagnosis not present

## 2020-06-21 DIAGNOSIS — M1712 Unilateral primary osteoarthritis, left knee: Secondary | ICD-10-CM | POA: Diagnosis not present

## 2020-06-24 DIAGNOSIS — G4733 Obstructive sleep apnea (adult) (pediatric): Secondary | ICD-10-CM | POA: Diagnosis not present

## 2020-07-12 DIAGNOSIS — G4733 Obstructive sleep apnea (adult) (pediatric): Secondary | ICD-10-CM | POA: Diagnosis not present

## 2020-07-24 DIAGNOSIS — G4733 Obstructive sleep apnea (adult) (pediatric): Secondary | ICD-10-CM | POA: Diagnosis not present

## 2020-08-11 DIAGNOSIS — G4733 Obstructive sleep apnea (adult) (pediatric): Secondary | ICD-10-CM | POA: Diagnosis not present

## 2020-08-24 DIAGNOSIS — G4733 Obstructive sleep apnea (adult) (pediatric): Secondary | ICD-10-CM | POA: Diagnosis not present

## 2020-09-24 DIAGNOSIS — G4733 Obstructive sleep apnea (adult) (pediatric): Secondary | ICD-10-CM | POA: Diagnosis not present

## 2020-10-10 DIAGNOSIS — G4733 Obstructive sleep apnea (adult) (pediatric): Secondary | ICD-10-CM | POA: Diagnosis not present

## 2020-10-13 DIAGNOSIS — R7302 Impaired glucose tolerance (oral): Secondary | ICD-10-CM | POA: Diagnosis not present

## 2020-10-13 DIAGNOSIS — Z0000 Encounter for general adult medical examination without abnormal findings: Secondary | ICD-10-CM | POA: Diagnosis not present

## 2020-10-13 DIAGNOSIS — G47429 Narcolepsy in conditions classified elsewhere without cataplexy: Secondary | ICD-10-CM | POA: Diagnosis not present

## 2020-10-13 DIAGNOSIS — Z136 Encounter for screening for cardiovascular disorders: Secondary | ICD-10-CM | POA: Diagnosis not present

## 2020-10-13 DIAGNOSIS — G4733 Obstructive sleep apnea (adult) (pediatric): Secondary | ICD-10-CM | POA: Diagnosis not present

## 2020-10-13 DIAGNOSIS — Z13220 Encounter for screening for lipoid disorders: Secondary | ICD-10-CM | POA: Diagnosis not present

## 2020-10-13 DIAGNOSIS — Z125 Encounter for screening for malignant neoplasm of prostate: Secondary | ICD-10-CM | POA: Diagnosis not present

## 2020-10-22 DIAGNOSIS — G4733 Obstructive sleep apnea (adult) (pediatric): Secondary | ICD-10-CM | POA: Diagnosis not present

## 2020-11-01 DIAGNOSIS — R0981 Nasal congestion: Secondary | ICD-10-CM | POA: Diagnosis not present

## 2020-11-01 DIAGNOSIS — R059 Cough, unspecified: Secondary | ICD-10-CM | POA: Diagnosis not present

## 2020-11-01 DIAGNOSIS — J029 Acute pharyngitis, unspecified: Secondary | ICD-10-CM | POA: Diagnosis not present

## 2020-11-07 DIAGNOSIS — G4733 Obstructive sleep apnea (adult) (pediatric): Secondary | ICD-10-CM | POA: Diagnosis not present

## 2020-11-22 DIAGNOSIS — G4733 Obstructive sleep apnea (adult) (pediatric): Secondary | ICD-10-CM | POA: Diagnosis not present

## 2020-12-08 DIAGNOSIS — G4733 Obstructive sleep apnea (adult) (pediatric): Secondary | ICD-10-CM | POA: Diagnosis not present

## 2020-12-22 DIAGNOSIS — G4733 Obstructive sleep apnea (adult) (pediatric): Secondary | ICD-10-CM | POA: Diagnosis not present

## 2020-12-27 DIAGNOSIS — M1712 Unilateral primary osteoarthritis, left knee: Secondary | ICD-10-CM | POA: Diagnosis not present

## 2021-01-03 DIAGNOSIS — M1712 Unilateral primary osteoarthritis, left knee: Secondary | ICD-10-CM | POA: Diagnosis not present

## 2021-01-10 DIAGNOSIS — M1712 Unilateral primary osteoarthritis, left knee: Secondary | ICD-10-CM | POA: Diagnosis not present

## 2021-01-17 DIAGNOSIS — M1712 Unilateral primary osteoarthritis, left knee: Secondary | ICD-10-CM | POA: Diagnosis not present

## 2021-01-22 DIAGNOSIS — G4733 Obstructive sleep apnea (adult) (pediatric): Secondary | ICD-10-CM | POA: Diagnosis not present

## 2021-02-21 DIAGNOSIS — G4733 Obstructive sleep apnea (adult) (pediatric): Secondary | ICD-10-CM | POA: Diagnosis not present

## 2021-02-22 DIAGNOSIS — S46211A Strain of muscle, fascia and tendon of other parts of biceps, right arm, initial encounter: Secondary | ICD-10-CM | POA: Diagnosis not present

## 2021-02-24 DIAGNOSIS — S46211A Strain of muscle, fascia and tendon of other parts of biceps, right arm, initial encounter: Secondary | ICD-10-CM | POA: Diagnosis not present

## 2021-02-27 DIAGNOSIS — S46211A Strain of muscle, fascia and tendon of other parts of biceps, right arm, initial encounter: Secondary | ICD-10-CM | POA: Diagnosis not present

## 2021-02-27 DIAGNOSIS — X58XXXA Exposure to other specified factors, initial encounter: Secondary | ICD-10-CM | POA: Diagnosis not present

## 2021-02-27 DIAGNOSIS — G8918 Other acute postprocedural pain: Secondary | ICD-10-CM | POA: Diagnosis not present

## 2021-02-27 DIAGNOSIS — Y999 Unspecified external cause status: Secondary | ICD-10-CM | POA: Diagnosis not present

## 2021-03-08 DIAGNOSIS — S46211A Strain of muscle, fascia and tendon of other parts of biceps, right arm, initial encounter: Secondary | ICD-10-CM | POA: Diagnosis not present

## 2021-03-21 DIAGNOSIS — G4733 Obstructive sleep apnea (adult) (pediatric): Secondary | ICD-10-CM | POA: Diagnosis not present

## 2021-04-21 DIAGNOSIS — G4733 Obstructive sleep apnea (adult) (pediatric): Secondary | ICD-10-CM | POA: Diagnosis not present

## 2021-05-21 DIAGNOSIS — G4733 Obstructive sleep apnea (adult) (pediatric): Secondary | ICD-10-CM | POA: Diagnosis not present

## 2021-07-01 IMAGING — DX DG CHEST 2V
2 series · 2 of 2 positions shown · non-contrast
Comparison: None.

CLINICAL DATA: Preop for knee replacement.

EXAM:
CHEST - 2 VIEW

[chest pa]
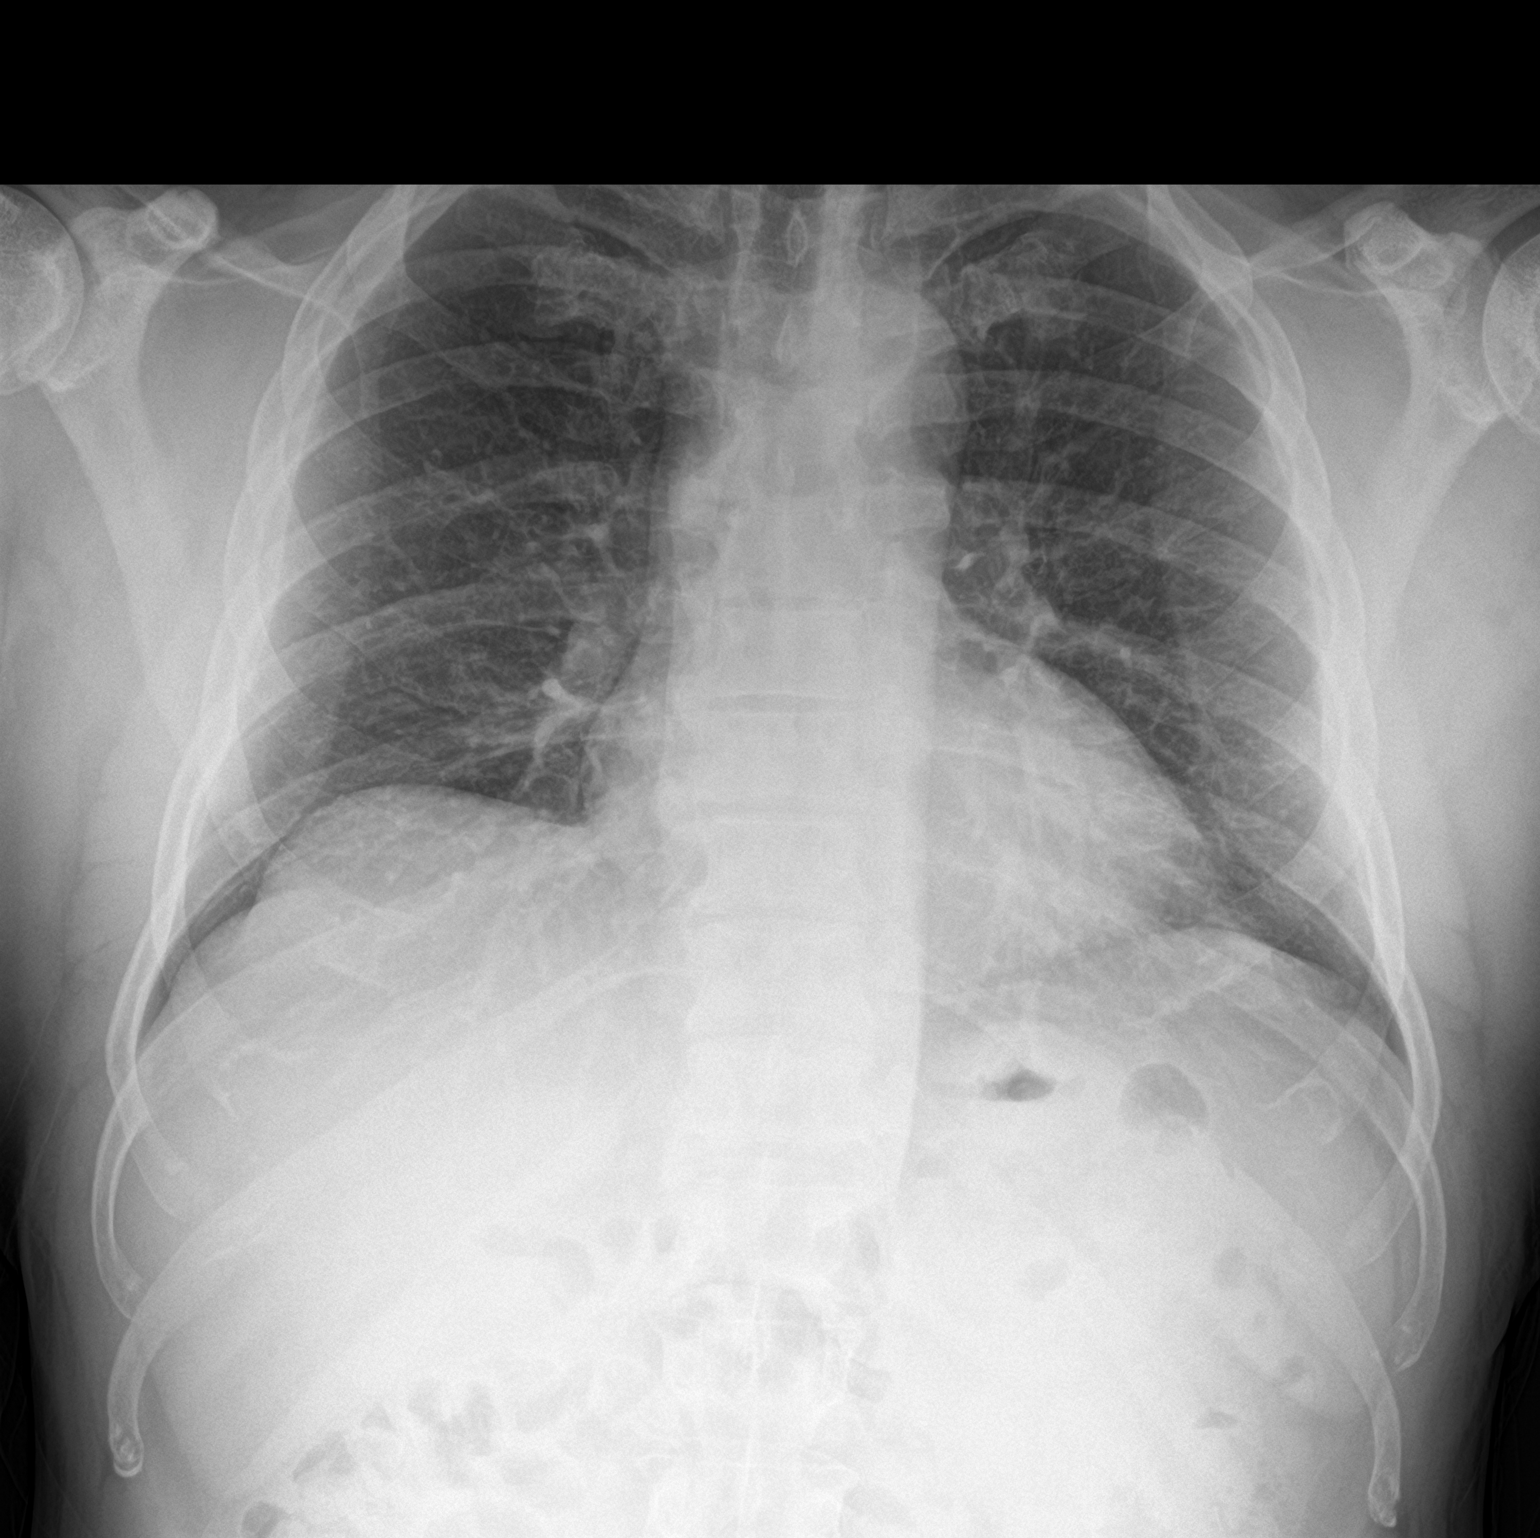

[chest lat]
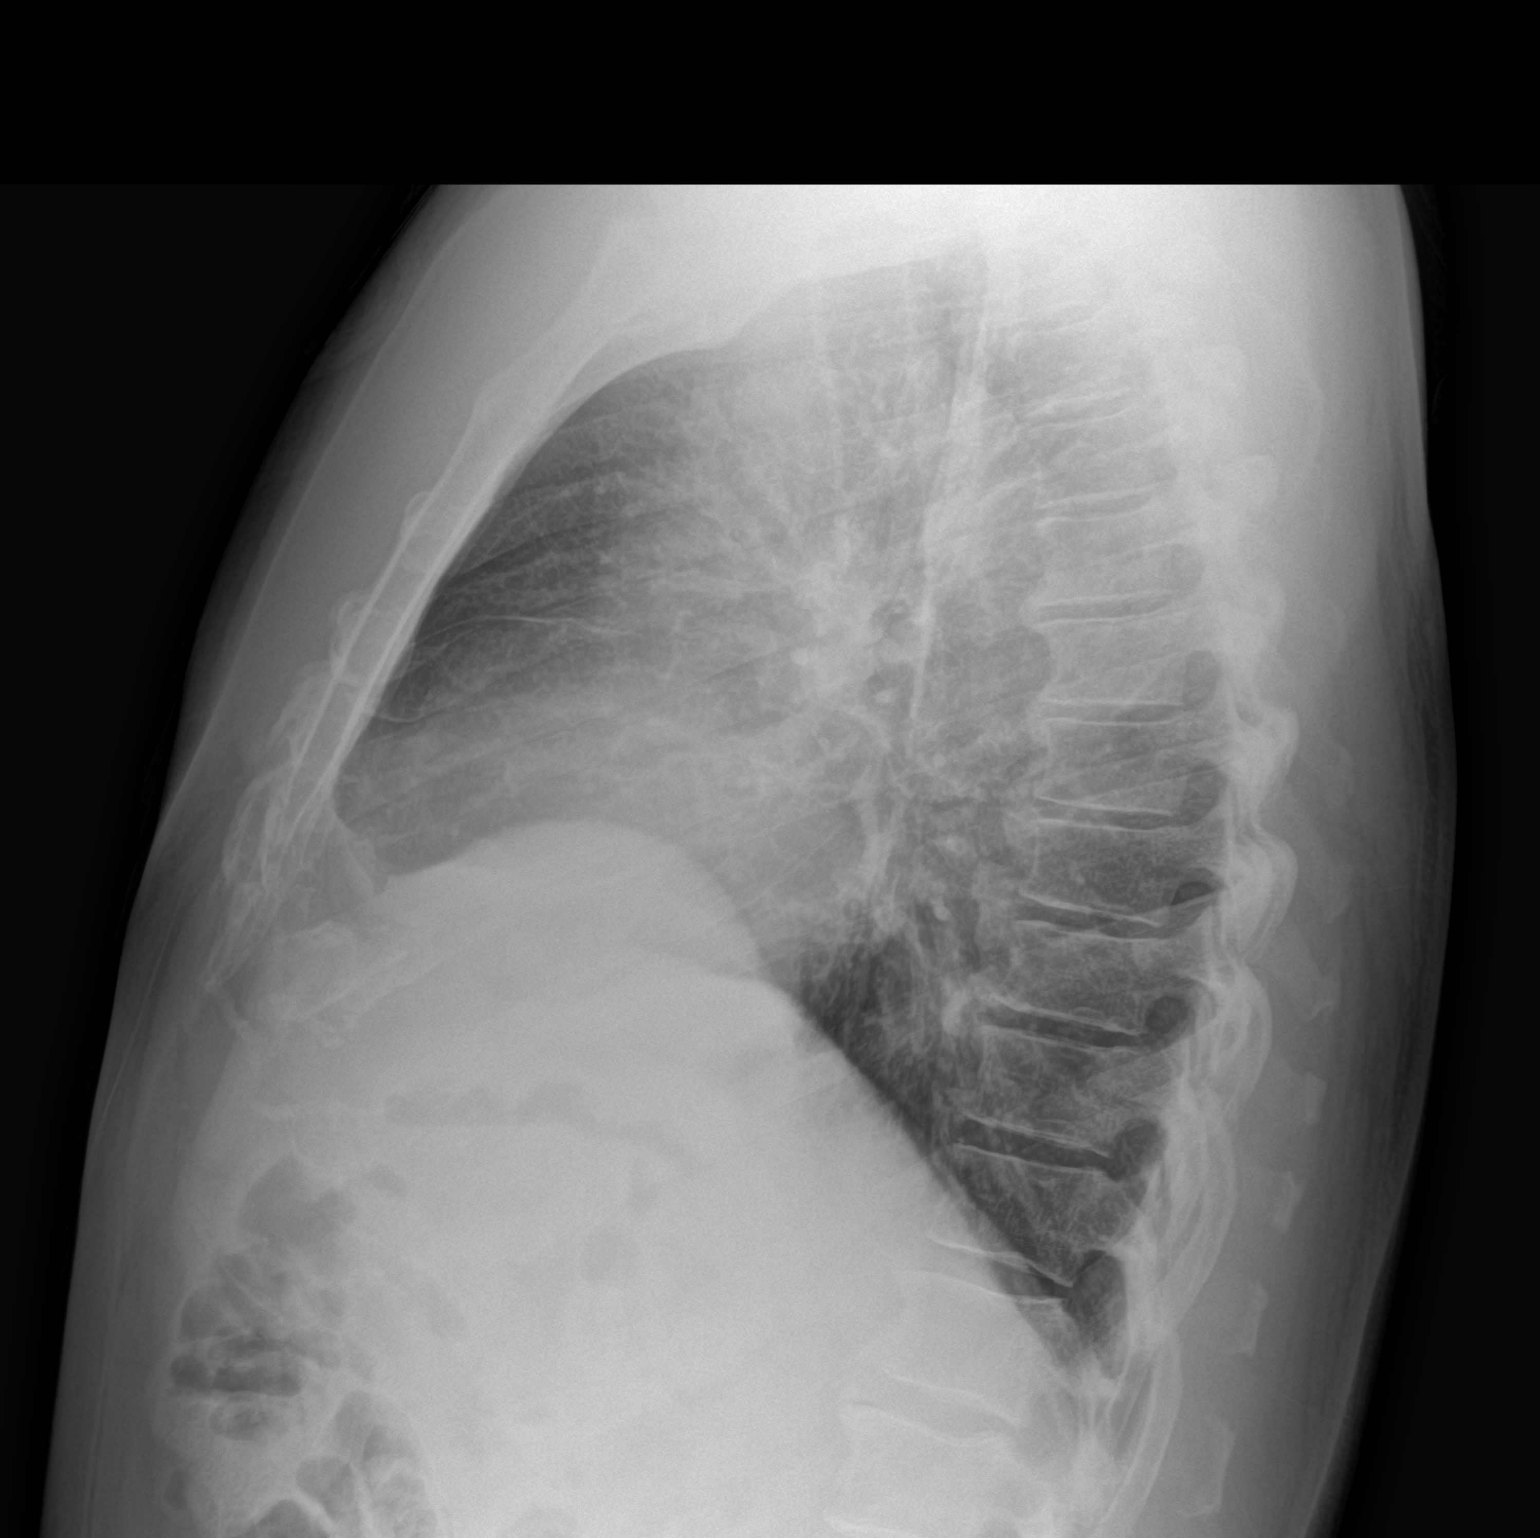

[2 of 2 positions shown; findings below may reference images not displayed]

FINDINGS: The heart size and mediastinal contours are within normal limits.
Both lungs are clear. The visualized skeletal structures are
unremarkable.
IMPRESSION: No active cardiopulmonary disease.

## 2021-07-26 DIAGNOSIS — M1712 Unilateral primary osteoarthritis, left knee: Secondary | ICD-10-CM | POA: Diagnosis not present

## 2021-08-02 DIAGNOSIS — M1712 Unilateral primary osteoarthritis, left knee: Secondary | ICD-10-CM | POA: Diagnosis not present

## 2021-08-09 DIAGNOSIS — M1712 Unilateral primary osteoarthritis, left knee: Secondary | ICD-10-CM | POA: Diagnosis not present

## 2021-08-23 DIAGNOSIS — M1712 Unilateral primary osteoarthritis, left knee: Secondary | ICD-10-CM | POA: Diagnosis not present

## 2021-10-16 DIAGNOSIS — R7302 Impaired glucose tolerance (oral): Secondary | ICD-10-CM | POA: Diagnosis not present

## 2021-10-16 DIAGNOSIS — F411 Generalized anxiety disorder: Secondary | ICD-10-CM | POA: Diagnosis not present

## 2021-10-16 DIAGNOSIS — Z136 Encounter for screening for cardiovascular disorders: Secondary | ICD-10-CM | POA: Diagnosis not present

## 2021-10-16 DIAGNOSIS — R69 Illness, unspecified: Secondary | ICD-10-CM | POA: Diagnosis not present

## 2021-10-16 DIAGNOSIS — G4733 Obstructive sleep apnea (adult) (pediatric): Secondary | ICD-10-CM | POA: Diagnosis not present

## 2021-10-16 DIAGNOSIS — Z13220 Encounter for screening for lipoid disorders: Secondary | ICD-10-CM | POA: Diagnosis not present

## 2021-10-16 DIAGNOSIS — Z5181 Encounter for therapeutic drug level monitoring: Secondary | ICD-10-CM | POA: Diagnosis not present

## 2021-10-16 DIAGNOSIS — Z125 Encounter for screening for malignant neoplasm of prostate: Secondary | ICD-10-CM | POA: Diagnosis not present

## 2021-10-16 DIAGNOSIS — Z0000 Encounter for general adult medical examination without abnormal findings: Secondary | ICD-10-CM | POA: Diagnosis not present

## 2021-10-25 DIAGNOSIS — Z8616 Personal history of COVID-19: Secondary | ICD-10-CM | POA: Diagnosis not present

## 2021-10-25 DIAGNOSIS — G4733 Obstructive sleep apnea (adult) (pediatric): Secondary | ICD-10-CM | POA: Diagnosis not present

## 2021-10-25 DIAGNOSIS — Z79899 Other long term (current) drug therapy: Secondary | ICD-10-CM | POA: Diagnosis not present

## 2021-10-25 DIAGNOSIS — R69 Illness, unspecified: Secondary | ICD-10-CM | POA: Diagnosis not present

## 2021-10-25 DIAGNOSIS — D45 Polycythemia vera: Secondary | ICD-10-CM | POA: Diagnosis not present

## 2021-10-25 DIAGNOSIS — D751 Secondary polycythemia: Secondary | ICD-10-CM | POA: Diagnosis not present

## 2021-10-25 DIAGNOSIS — I10 Essential (primary) hypertension: Secondary | ICD-10-CM | POA: Diagnosis not present

## 2021-10-25 DIAGNOSIS — E785 Hyperlipidemia, unspecified: Secondary | ICD-10-CM | POA: Diagnosis not present

## 2021-10-26 DIAGNOSIS — D751 Secondary polycythemia: Secondary | ICD-10-CM | POA: Diagnosis not present

## 2021-10-27 DIAGNOSIS — D751 Secondary polycythemia: Secondary | ICD-10-CM | POA: Diagnosis not present

## 2021-10-30 DIAGNOSIS — D45 Polycythemia vera: Secondary | ICD-10-CM | POA: Diagnosis not present

## 2021-11-03 DIAGNOSIS — D751 Secondary polycythemia: Secondary | ICD-10-CM | POA: Diagnosis not present

## 2021-11-03 DIAGNOSIS — E291 Testicular hypofunction: Secondary | ICD-10-CM | POA: Diagnosis not present

## 2021-11-06 DIAGNOSIS — D751 Secondary polycythemia: Secondary | ICD-10-CM | POA: Diagnosis not present

## 2021-11-10 DIAGNOSIS — D751 Secondary polycythemia: Secondary | ICD-10-CM | POA: Diagnosis not present

## 2021-11-17 DIAGNOSIS — D751 Secondary polycythemia: Secondary | ICD-10-CM | POA: Diagnosis not present

## 2021-11-20 DIAGNOSIS — E291 Testicular hypofunction: Secondary | ICD-10-CM | POA: Diagnosis not present

## 2021-11-20 DIAGNOSIS — D751 Secondary polycythemia: Secondary | ICD-10-CM | POA: Diagnosis not present

## 2021-11-23 DIAGNOSIS — D751 Secondary polycythemia: Secondary | ICD-10-CM | POA: Diagnosis not present

## 2021-11-27 DIAGNOSIS — F410 Panic disorder [episodic paroxysmal anxiety] without agoraphobia: Secondary | ICD-10-CM | POA: Diagnosis not present

## 2021-11-27 DIAGNOSIS — F1320 Sedative, hypnotic or anxiolytic dependence, uncomplicated: Secondary | ICD-10-CM | POA: Diagnosis not present

## 2021-11-27 DIAGNOSIS — R69 Illness, unspecified: Secondary | ICD-10-CM | POA: Diagnosis not present

## 2021-11-27 DIAGNOSIS — F411 Generalized anxiety disorder: Secondary | ICD-10-CM | POA: Diagnosis not present

## 2021-11-27 DIAGNOSIS — Z5181 Encounter for therapeutic drug level monitoring: Secondary | ICD-10-CM | POA: Diagnosis not present

## 2021-11-27 DIAGNOSIS — Z79899 Other long term (current) drug therapy: Secondary | ICD-10-CM | POA: Diagnosis not present

## 2021-11-27 DIAGNOSIS — D751 Secondary polycythemia: Secondary | ICD-10-CM | POA: Diagnosis not present

## 2021-11-27 DIAGNOSIS — F902 Attention-deficit hyperactivity disorder, combined type: Secondary | ICD-10-CM | POA: Diagnosis not present

## 2021-12-15 DIAGNOSIS — D751 Secondary polycythemia: Secondary | ICD-10-CM | POA: Diagnosis not present

## 2021-12-18 DIAGNOSIS — R69 Illness, unspecified: Secondary | ICD-10-CM | POA: Diagnosis not present

## 2021-12-18 DIAGNOSIS — F410 Panic disorder [episodic paroxysmal anxiety] without agoraphobia: Secondary | ICD-10-CM | POA: Diagnosis not present

## 2021-12-18 DIAGNOSIS — F902 Attention-deficit hyperactivity disorder, combined type: Secondary | ICD-10-CM | POA: Diagnosis not present

## 2021-12-18 DIAGNOSIS — F411 Generalized anxiety disorder: Secondary | ICD-10-CM | POA: Diagnosis not present

## 2022-01-08 DIAGNOSIS — F902 Attention-deficit hyperactivity disorder, combined type: Secondary | ICD-10-CM | POA: Diagnosis not present

## 2022-01-08 DIAGNOSIS — F410 Panic disorder [episodic paroxysmal anxiety] without agoraphobia: Secondary | ICD-10-CM | POA: Diagnosis not present

## 2022-01-08 DIAGNOSIS — R69 Illness, unspecified: Secondary | ICD-10-CM | POA: Diagnosis not present

## 2022-01-08 DIAGNOSIS — F411 Generalized anxiety disorder: Secondary | ICD-10-CM | POA: Diagnosis not present

## 2022-01-19 DIAGNOSIS — D751 Secondary polycythemia: Secondary | ICD-10-CM | POA: Diagnosis not present

## 2022-01-26 DIAGNOSIS — D751 Secondary polycythemia: Secondary | ICD-10-CM | POA: Diagnosis not present

## 2022-02-07 DIAGNOSIS — M1712 Unilateral primary osteoarthritis, left knee: Secondary | ICD-10-CM | POA: Diagnosis not present

## 2022-02-07 DIAGNOSIS — F902 Attention-deficit hyperactivity disorder, combined type: Secondary | ICD-10-CM | POA: Diagnosis not present

## 2022-02-07 DIAGNOSIS — R69 Illness, unspecified: Secondary | ICD-10-CM | POA: Diagnosis not present

## 2022-02-07 DIAGNOSIS — F411 Generalized anxiety disorder: Secondary | ICD-10-CM | POA: Diagnosis not present

## 2022-02-07 DIAGNOSIS — F410 Panic disorder [episodic paroxysmal anxiety] without agoraphobia: Secondary | ICD-10-CM | POA: Diagnosis not present

## 2022-02-14 DIAGNOSIS — G4733 Obstructive sleep apnea (adult) (pediatric): Secondary | ICD-10-CM | POA: Diagnosis not present

## 2022-03-01 DIAGNOSIS — M1712 Unilateral primary osteoarthritis, left knee: Secondary | ICD-10-CM | POA: Diagnosis not present

## 2022-03-02 DIAGNOSIS — D751 Secondary polycythemia: Secondary | ICD-10-CM | POA: Diagnosis not present

## 2022-03-05 DIAGNOSIS — D751 Secondary polycythemia: Secondary | ICD-10-CM | POA: Diagnosis not present

## 2022-03-08 DIAGNOSIS — M1712 Unilateral primary osteoarthritis, left knee: Secondary | ICD-10-CM | POA: Diagnosis not present

## 2022-03-15 DIAGNOSIS — G4733 Obstructive sleep apnea (adult) (pediatric): Secondary | ICD-10-CM | POA: Diagnosis not present

## 2022-03-15 DIAGNOSIS — M1712 Unilateral primary osteoarthritis, left knee: Secondary | ICD-10-CM | POA: Diagnosis not present

## 2022-03-15 DIAGNOSIS — D751 Secondary polycythemia: Secondary | ICD-10-CM | POA: Diagnosis not present

## 2022-03-16 DIAGNOSIS — G4733 Obstructive sleep apnea (adult) (pediatric): Secondary | ICD-10-CM | POA: Diagnosis not present

## 2022-04-03 DIAGNOSIS — F902 Attention-deficit hyperactivity disorder, combined type: Secondary | ICD-10-CM | POA: Diagnosis not present

## 2022-04-03 DIAGNOSIS — F1320 Sedative, hypnotic or anxiolytic dependence, uncomplicated: Secondary | ICD-10-CM | POA: Diagnosis not present

## 2022-04-03 DIAGNOSIS — F410 Panic disorder [episodic paroxysmal anxiety] without agoraphobia: Secondary | ICD-10-CM | POA: Diagnosis not present

## 2022-04-03 DIAGNOSIS — R69 Illness, unspecified: Secondary | ICD-10-CM | POA: Diagnosis not present

## 2022-04-03 DIAGNOSIS — F411 Generalized anxiety disorder: Secondary | ICD-10-CM | POA: Diagnosis not present

## 2022-04-03 DIAGNOSIS — Z5181 Encounter for therapeutic drug level monitoring: Secondary | ICD-10-CM | POA: Diagnosis not present

## 2022-04-03 DIAGNOSIS — Z79899 Other long term (current) drug therapy: Secondary | ICD-10-CM | POA: Diagnosis not present

## 2022-04-16 DIAGNOSIS — D751 Secondary polycythemia: Secondary | ICD-10-CM | POA: Diagnosis not present

## 2022-04-16 DIAGNOSIS — G4733 Obstructive sleep apnea (adult) (pediatric): Secondary | ICD-10-CM | POA: Diagnosis not present

## 2022-04-27 DIAGNOSIS — D751 Secondary polycythemia: Secondary | ICD-10-CM | POA: Diagnosis not present

## 2022-04-27 DIAGNOSIS — G4733 Obstructive sleep apnea (adult) (pediatric): Secondary | ICD-10-CM | POA: Diagnosis not present

## 2022-05-01 DIAGNOSIS — F902 Attention-deficit hyperactivity disorder, combined type: Secondary | ICD-10-CM | POA: Diagnosis not present

## 2022-05-01 DIAGNOSIS — R69 Illness, unspecified: Secondary | ICD-10-CM | POA: Diagnosis not present

## 2022-05-01 DIAGNOSIS — F410 Panic disorder [episodic paroxysmal anxiety] without agoraphobia: Secondary | ICD-10-CM | POA: Diagnosis not present

## 2022-05-01 DIAGNOSIS — F411 Generalized anxiety disorder: Secondary | ICD-10-CM | POA: Diagnosis not present

## 2022-05-15 DIAGNOSIS — G4733 Obstructive sleep apnea (adult) (pediatric): Secondary | ICD-10-CM | POA: Diagnosis not present

## 2022-05-28 DIAGNOSIS — G4733 Obstructive sleep apnea (adult) (pediatric): Secondary | ICD-10-CM | POA: Diagnosis not present

## 2022-05-28 DIAGNOSIS — D751 Secondary polycythemia: Secondary | ICD-10-CM | POA: Diagnosis not present

## 2022-05-28 DIAGNOSIS — E291 Testicular hypofunction: Secondary | ICD-10-CM | POA: Diagnosis not present

## 2022-05-29 DIAGNOSIS — R69 Illness, unspecified: Secondary | ICD-10-CM | POA: Diagnosis not present

## 2022-05-29 DIAGNOSIS — F902 Attention-deficit hyperactivity disorder, combined type: Secondary | ICD-10-CM | POA: Diagnosis not present

## 2022-05-29 DIAGNOSIS — F410 Panic disorder [episodic paroxysmal anxiety] without agoraphobia: Secondary | ICD-10-CM | POA: Diagnosis not present

## 2022-05-29 DIAGNOSIS — F411 Generalized anxiety disorder: Secondary | ICD-10-CM | POA: Diagnosis not present

## 2022-06-14 DIAGNOSIS — G4733 Obstructive sleep apnea (adult) (pediatric): Secondary | ICD-10-CM | POA: Diagnosis not present

## 2022-07-15 DIAGNOSIS — G4733 Obstructive sleep apnea (adult) (pediatric): Secondary | ICD-10-CM | POA: Diagnosis not present

## 2022-07-25 DIAGNOSIS — R1013 Epigastric pain: Secondary | ICD-10-CM | POA: Diagnosis not present

## 2022-07-26 DIAGNOSIS — M7022 Olecranon bursitis, left elbow: Secondary | ICD-10-CM | POA: Diagnosis not present

## 2022-07-26 DIAGNOSIS — R1013 Epigastric pain: Secondary | ICD-10-CM | POA: Diagnosis not present

## 2022-07-27 DIAGNOSIS — E291 Testicular hypofunction: Secondary | ICD-10-CM | POA: Diagnosis not present

## 2022-07-27 DIAGNOSIS — G4733 Obstructive sleep apnea (adult) (pediatric): Secondary | ICD-10-CM | POA: Diagnosis not present

## 2022-07-27 DIAGNOSIS — D751 Secondary polycythemia: Secondary | ICD-10-CM | POA: Diagnosis not present

## 2022-07-31 DIAGNOSIS — R69 Illness, unspecified: Secondary | ICD-10-CM | POA: Diagnosis not present

## 2022-07-31 DIAGNOSIS — F902 Attention-deficit hyperactivity disorder, combined type: Secondary | ICD-10-CM | POA: Diagnosis not present

## 2022-07-31 DIAGNOSIS — F410 Panic disorder [episodic paroxysmal anxiety] without agoraphobia: Secondary | ICD-10-CM | POA: Diagnosis not present

## 2022-07-31 DIAGNOSIS — F411 Generalized anxiety disorder: Secondary | ICD-10-CM | POA: Diagnosis not present

## 2022-08-17 DIAGNOSIS — M7022 Olecranon bursitis, left elbow: Secondary | ICD-10-CM | POA: Diagnosis not present

## 2022-08-22 DIAGNOSIS — M7022 Olecranon bursitis, left elbow: Secondary | ICD-10-CM | POA: Diagnosis not present

## 2022-10-04 DIAGNOSIS — I10 Essential (primary) hypertension: Secondary | ICD-10-CM | POA: Diagnosis not present

## 2022-10-04 DIAGNOSIS — Z6831 Body mass index (BMI) 31.0-31.9, adult: Secondary | ICD-10-CM | POA: Diagnosis not present

## 2022-10-04 DIAGNOSIS — M25562 Pain in left knee: Secondary | ICD-10-CM | POA: Diagnosis not present

## 2022-10-04 DIAGNOSIS — Z299 Encounter for prophylactic measures, unspecified: Secondary | ICD-10-CM | POA: Diagnosis not present

## 2022-10-04 DIAGNOSIS — G8929 Other chronic pain: Secondary | ICD-10-CM | POA: Diagnosis not present

## 2022-10-04 DIAGNOSIS — R69 Illness, unspecified: Secondary | ICD-10-CM | POA: Diagnosis not present

## 2022-10-08 DIAGNOSIS — Z299 Encounter for prophylactic measures, unspecified: Secondary | ICD-10-CM | POA: Diagnosis not present

## 2022-10-08 DIAGNOSIS — G8929 Other chronic pain: Secondary | ICD-10-CM | POA: Diagnosis not present

## 2022-10-08 DIAGNOSIS — I10 Essential (primary) hypertension: Secondary | ICD-10-CM | POA: Diagnosis not present

## 2022-10-08 DIAGNOSIS — M25562 Pain in left knee: Secondary | ICD-10-CM | POA: Diagnosis not present

## 2022-10-09 DIAGNOSIS — Z299 Encounter for prophylactic measures, unspecified: Secondary | ICD-10-CM | POA: Diagnosis not present

## 2022-10-09 DIAGNOSIS — Z7189 Other specified counseling: Secondary | ICD-10-CM | POA: Diagnosis not present

## 2022-10-09 DIAGNOSIS — Z0000 Encounter for general adult medical examination without abnormal findings: Secondary | ICD-10-CM | POA: Diagnosis not present

## 2022-10-09 DIAGNOSIS — Z125 Encounter for screening for malignant neoplasm of prostate: Secondary | ICD-10-CM | POA: Diagnosis not present

## 2022-10-09 DIAGNOSIS — I10 Essential (primary) hypertension: Secondary | ICD-10-CM | POA: Diagnosis not present

## 2022-10-09 DIAGNOSIS — Z6831 Body mass index (BMI) 31.0-31.9, adult: Secondary | ICD-10-CM | POA: Diagnosis not present

## 2022-10-09 DIAGNOSIS — Z1331 Encounter for screening for depression: Secondary | ICD-10-CM | POA: Diagnosis not present

## 2022-10-09 DIAGNOSIS — Z79899 Other long term (current) drug therapy: Secondary | ICD-10-CM | POA: Diagnosis not present

## 2022-10-09 DIAGNOSIS — Z1339 Encounter for screening examination for other mental health and behavioral disorders: Secondary | ICD-10-CM | POA: Diagnosis not present

## 2022-10-24 DIAGNOSIS — M1712 Unilateral primary osteoarthritis, left knee: Secondary | ICD-10-CM | POA: Diagnosis not present

## 2022-10-26 DIAGNOSIS — D751 Secondary polycythemia: Secondary | ICD-10-CM | POA: Diagnosis not present

## 2022-10-26 DIAGNOSIS — G4733 Obstructive sleep apnea (adult) (pediatric): Secondary | ICD-10-CM | POA: Diagnosis not present

## 2022-10-26 DIAGNOSIS — E291 Testicular hypofunction: Secondary | ICD-10-CM | POA: Diagnosis not present

## 2022-11-01 DIAGNOSIS — R69 Illness, unspecified: Secondary | ICD-10-CM | POA: Diagnosis not present

## 2022-11-01 DIAGNOSIS — Z5181 Encounter for therapeutic drug level monitoring: Secondary | ICD-10-CM | POA: Diagnosis not present

## 2022-11-01 DIAGNOSIS — F410 Panic disorder [episodic paroxysmal anxiety] without agoraphobia: Secondary | ICD-10-CM | POA: Diagnosis not present

## 2022-11-01 DIAGNOSIS — Z79899 Other long term (current) drug therapy: Secondary | ICD-10-CM | POA: Diagnosis not present

## 2022-11-01 DIAGNOSIS — F902 Attention-deficit hyperactivity disorder, combined type: Secondary | ICD-10-CM | POA: Diagnosis not present

## 2022-11-01 DIAGNOSIS — F411 Generalized anxiety disorder: Secondary | ICD-10-CM | POA: Diagnosis not present

## 2022-11-01 DIAGNOSIS — F1320 Sedative, hypnotic or anxiolytic dependence, uncomplicated: Secondary | ICD-10-CM | POA: Diagnosis not present

## 2022-11-06 DIAGNOSIS — M1712 Unilateral primary osteoarthritis, left knee: Secondary | ICD-10-CM | POA: Diagnosis not present

## 2022-12-12 ENCOUNTER — Telehealth: Payer: Self-pay | Admitting: Neurology

## 2022-12-12 ENCOUNTER — Encounter: Payer: Self-pay | Admitting: Neurology

## 2022-12-12 DIAGNOSIS — M1712 Unilateral primary osteoarthritis, left knee: Secondary | ICD-10-CM | POA: Diagnosis not present

## 2022-12-12 NOTE — Telephone Encounter (Signed)
LVM and sent letter in mail informing pt of need to reschedule 01/10/23 appointment - MD out

## 2022-12-31 DIAGNOSIS — Z299 Encounter for prophylactic measures, unspecified: Secondary | ICD-10-CM | POA: Diagnosis not present

## 2022-12-31 DIAGNOSIS — J209 Acute bronchitis, unspecified: Secondary | ICD-10-CM | POA: Diagnosis not present

## 2022-12-31 DIAGNOSIS — R0981 Nasal congestion: Secondary | ICD-10-CM | POA: Diagnosis not present

## 2023-01-10 ENCOUNTER — Ambulatory Visit: Payer: Medicare HMO | Admitting: Neurology

## 2023-01-10 DIAGNOSIS — G4733 Obstructive sleep apnea (adult) (pediatric): Secondary | ICD-10-CM | POA: Diagnosis not present

## 2023-01-24 DIAGNOSIS — F902 Attention-deficit hyperactivity disorder, combined type: Secondary | ICD-10-CM | POA: Diagnosis not present

## 2023-01-24 DIAGNOSIS — F411 Generalized anxiety disorder: Secondary | ICD-10-CM | POA: Diagnosis not present

## 2023-01-24 DIAGNOSIS — F410 Panic disorder [episodic paroxysmal anxiety] without agoraphobia: Secondary | ICD-10-CM | POA: Diagnosis not present

## 2023-01-25 DIAGNOSIS — D45 Polycythemia vera: Secondary | ICD-10-CM | POA: Diagnosis not present

## 2023-02-10 DIAGNOSIS — G4733 Obstructive sleep apnea (adult) (pediatric): Secondary | ICD-10-CM | POA: Diagnosis not present

## 2023-02-13 DIAGNOSIS — M7022 Olecranon bursitis, left elbow: Secondary | ICD-10-CM | POA: Diagnosis not present

## 2023-02-27 DIAGNOSIS — D751 Secondary polycythemia: Secondary | ICD-10-CM | POA: Diagnosis not present

## 2023-02-28 DIAGNOSIS — R5383 Other fatigue: Secondary | ICD-10-CM | POA: Diagnosis not present

## 2023-02-28 DIAGNOSIS — Z299 Encounter for prophylactic measures, unspecified: Secondary | ICD-10-CM | POA: Diagnosis not present

## 2023-02-28 DIAGNOSIS — I10 Essential (primary) hypertension: Secondary | ICD-10-CM | POA: Diagnosis not present

## 2023-02-28 DIAGNOSIS — D582 Other hemoglobinopathies: Secondary | ICD-10-CM | POA: Diagnosis not present

## 2023-03-12 DIAGNOSIS — G4733 Obstructive sleep apnea (adult) (pediatric): Secondary | ICD-10-CM | POA: Diagnosis not present

## 2023-04-08 DIAGNOSIS — M7022 Olecranon bursitis, left elbow: Secondary | ICD-10-CM | POA: Diagnosis not present

## 2023-04-11 ENCOUNTER — Ambulatory Visit: Payer: Medicare HMO | Admitting: Neurology

## 2023-04-11 ENCOUNTER — Encounter: Payer: Self-pay | Admitting: Neurology

## 2023-04-11 VITALS — BP 131/83 | HR 70 | Ht 69.0 in | Wt 215.0 lb

## 2023-04-11 DIAGNOSIS — Z9989 Dependence on other enabling machines and devices: Secondary | ICD-10-CM | POA: Diagnosis not present

## 2023-04-11 DIAGNOSIS — M25522 Pain in left elbow: Secondary | ICD-10-CM | POA: Diagnosis not present

## 2023-04-11 DIAGNOSIS — G4733 Obstructive sleep apnea (adult) (pediatric): Secondary | ICD-10-CM

## 2023-04-11 DIAGNOSIS — R0683 Snoring: Secondary | ICD-10-CM

## 2023-04-11 MED ORDER — FEXOFENADINE HCL 180 MG PO TABS: 180.0000 mg | ORAL_TABLET | Freq: Every day | ORAL | 5 refills | Status: AC

## 2023-04-11 NOTE — Progress Notes (Signed)
Provider:  Melvyn Novas, MD    SLEEP MEDICINE CLINIC   Primary Care Physician:  [facility] [address] [place] [address]     Referring Provider: [facility] [address] [place],  [place] [address]          Chief Complaint according to patient   Patient presents with:     New Patient (Initial Visit)     He tested positive for mild OSA and has had no hypoxia of clinical significance. He was last seen here  for a sleep test ( HST ) in 11-30-2019.      HISTORY OF PRESENT ILLNESS:  Jeffrey Chapman is a 62 y.o. male patient who is here for a new consult , but not for a new problem.  04/11/2023 for  a new consult after 3 years of absence. he was seen here in Spring of 2021 for  erythrocytosis and high H and H, and had a negative sleep study for hypoxia, mild OSA:   "Towana Badger, MD - 10/26/2022 2:30 PM EST Formatting of this note is different from the original. 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  Workup for primary polycythemia including JAK2 mutational testing was negative. Serum erythropoietin levels in the past were at the high end of normal. After discontinuation of testosterone supplements and improved compliance with CPAP usage, his hemoglobin has improved.  He has not required a phlebotomy since July 2023. His CBC is stable. He denies any symptoms of headache. He is actively working out and is managing his weight and pays attention to his diet. No fatigue no abdominal discomfort. "  My Concern:  Jeffrey Chapman has not been on testosterone for over 20 years, and he has not had hypoxia on HST!   04-11-2023:  Jeffrey Chapman endorsed the fatigue severity scale today at 14 points, the Epworth Sleepiness Scale at 5 points, he has no significant sleep concerns his geriatric depression score has been endorsed at 2  out of 15 points which is not clinically significant.  He is a compliant CPAP user 100% of the days and 100% of those days over 4 hours or more.  He averages 8 hours 24 minutes.  The minimum pressure for his auto CPAP is at 5 the maximum pressure at 15 cm water was 2 cm water expiratory pressure relief and his residual AHI is 1.2/h.  Given that his baseline apnea hypopnea index or AHI was also mild this is still a reduction of over 80%.  This patient had high levels of RDI respiratory disturbances and that these rather than apneas and hypopneas the RDI for the study was 21.9/h while the AHI was 9.4/h and it was a strongly REM dependent sleep apnea.  No hypoxia.  Intermittent bradycardia was noted.  I like for him to continue his CPAP but I will order an overnight pulse oximetry while on CPAP.  His machine was issued on 5-5 2021 and he will be due for a new machine by May 2026.     Chief concern according to patient :  The hematologist just released me for his care.   WBC 4.0 - 10.5 10*9/L 8.9  RBC 4.10 - 5.60 10*12/L 5.87 High   HGB 12.5 - 17.0 g/dL 25.3 High   HCT 66.4 - 50.0 % 51.7 High   MCV 80.0 -  98.0 fL 88.1  MCH 27.0 - 34.0 pg 29.5  MCHC 32.0 - 36.0 g/dL 44.0  RDW 10.2 - 72.5 % 15.5 High   MPV 7.4 - 10.4 fL 9.5   WBC 4.0 - 10.5 10*9/L 9.5  RBC 4.10 - 5.60 10*12/L 5.59  HGB 12.5 - 17.0 g/dL 36.6 High   HCT 44.0 - 50.0 % 50.0  MCV 80.0 - 98.0 fL 89.4  MCH 27.0 - 34.0 pg 30.6  MCHC 32.0 - 36.0 g/dL 34.7  RDW 42.5 - 95.6 % 14.6 High   MPV 7.4 - 10.4 fL 9.7  Platelet 140 - 415 10*9/L 221      He tested positive for mild OSA and has had no hypoxia of clinical significance. He was last seen here  for a sleep test ( HST ) in 11-30-2019.    Jeffrey Chapman is a 62 year old male patient and was seen on 11/05/2019, upon referral from [facility].  I have the pleasure of seeing him today, a right -handed White or Caucasian male with a medical history of ADD (attention  deficit disorder) (2015), ED (erectile dysfunction) (2015), Hepatitis B (1977), High blood hemoglobin A2 (HCC), Hyperlipemia (2015), Hypertension, Hypoaldosteronism (HCC) (2015), Hypogonadism, testicular (2015), and Seasonal allergies.   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  One of the questions is could nocturnal hypoxemia related or unrelated to apnea caused this increase in red blood cells.     Jeffrey Chapman is a 62 year old  Caucasian male patient seen here upon referral on 11/05/2019 from [facility] I have the pleasure of seeing Jeffrey Chapman today, a right -handed White or Caucasian male with a possible sleep disorder.  He  has a past medical history of ADD (attention deficit disorder) (2015), Anxiety, Arthritis, ED (erectile dysfunction) (2015), Hepatitis B (1977), High blood hemoglobin A2 (HCC), Hyperlipemia (2015), Hypertension, Hypoaldosteronism (HCC) (2015), Hypogonadism, testicular (2015), and Seasonal allergies..  Jeffrey Chapman referral is based not so much on insomnia or poor sleep or excessive daytime sleepiness which she also endorsed with an Epworth score of 15 out of 24.  The main concern of his primary physicians is that he has polycythemia vera and very high levels of red blood cells.  One of the questions is could nocturnal hypoxemia related or unrelated to apnea caused this increase in red blood cells.    The patient has been treated for depression for a long time he was taking Wellbutrin in the morning and he had been taking Seroquel for insomnia at night.  He also tookZanaflex which is drowsiness provoking and was on Neurontin which also caused drowsiness.  He is taking modafinil still in daytime also some of the more drowsy making medications have been discontinued.  He is taking the generic form 100 mg in the morning.  He is officially on Seroquel 50 mg at night which is not a high dose- he D/C the medication  because of drowsiness.        Review of Systems: Out of a complete 14 system review, the patient complains of only the following symptoms, and all other reviewed systems are negative.:  Fatigue, sleepiness , none on CPAP>    How likely are you to doze in the following situations: 0 = not likely, 1 = slight chance, 2 = moderate chance, 3 = high chance   Sitting and Reading? Watching Television? Sitting inactive in a public place (theater  or meeting)? As a passenger in a car for an hour without a break? Lying down in the afternoon when circumstances permit? Sitting and talking to someone? Sitting quietly after lunch without alcohol? In a car, while stopped for a few minutes in traffic?   Jeffrey Chapman endorsed the fatigue severity scale today at 14 points, the Epworth Sleepiness Scale at 5 points, he has no significant sleep concerns his geriatric depression score has been endorsed at 2 out of 15 points which is not clinically significant.   Pre CPAP testing ; Total = 15/ 24 points , he ws on Modafinil at the time.   Social History   Socioeconomic History   Marital status: Married    Spouse name: Not on file   Number of children: Not on file   Years of education: Not on file   Highest education level: Not on file  Occupational History   Not on file  Tobacco Use   Smoking status: Never   Smokeless tobacco: Never  Vaping Use   Vaping status: Never Used  Substance and Sexual Activity   Alcohol use: Yes    Alcohol/week: 1.0 standard drink of alcohol    Types: 1 Shots of liquor per week   Drug use: No   Sexual activity: Yes  Other Topics Concern   Not on file  Social History Narrative   Not on file   Social Determinants of Health   Financial Resource Strain: Low Risk  (10/25/2021)   Received from [facility], [facility]   Overall Financial Resource Strain (CARDIA)    Difficulty of Paying Living Expenses: Not hard at all  Food Insecurity: No Food Insecurity  (10/25/2021)   Received from [facility], [facility]   Hunger Vital Sign    Worried About Running Out of Food in the Last Year: Never true    Ran Out of Food in the Last Year: Never true  Transportation Needs: No Transportation Needs (10/16/2021)   Received from [facility], [facility]   [facility] - Transportation    Lack of Transportation (Medical): No    Lack of Transportation (Non-Medical): No  Physical Activity: Sufficiently Active (10/25/2021)   Received from [facility], [facility]   Exercise Vital Sign    Days of Exercise per Week: 3 days    Minutes of Exercise per Session: 50 min  Stress: No Stress Concern Present (10/25/2021)   Received from [facility], [facility] of [facility] - Occupational Stress Questionnaire    Feeling of Stress : Not at all  Social Connections: Moderately Integrated (10/25/2021)   Received from [facility], [facility]   Social Connection and Isolation Panel [NHANES]    Frequency of Communication with Friends and Family: More than three times a week    Frequency of Social Gatherings with Friends and Family: Twice a week    Attends Religious Services: More than 4 times per year    Active Member of [employer] or Organizations: No    Attends Banker Meetings: Never    Marital Status: Married    Family History  Problem Relation Age of Onset   Asthma Mother    COPD Father    Cancer Brother     Past Medical History:  Diagnosis Date   ADD (attention deficit disorder) 2015   Anxiety    Arthritis    ED (erectile dysfunction) 2015  Hepatitis B 1977   High blood hemoglobin A2 (HCC)    Hyperlipemia 2015   Hypertension    Hypoaldosteronism (HCC) 2015   Hypogonadism, testicular 2015   Seasonal allergies     Past Surgical History:  Procedure Laterality Date   KNEE ARTHROSCOPY W/ MENISCAL REPAIR Left    TOTAL KNEE 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     aspirin EC 81 MG tablet Take 1 tablet (81 mg total) by mouth 2 (two) times daily. 60 tablet 0   busPIRone (BUSPAR) 15 MG tablet Take 15 mg by mouth 3 (three) times daily as needed.     Cholecalciferol (DIALYVITE VITAMIN D 5000 PO) Take 5,000 Units by mouth daily.     losartan (COZAAR) 25 MG tablet Take 25 mg by mouth daily.     magnesium gluconate (MAGONATE) 500 MG tablet Take 500 mg by mouth daily.     zinc gluconate 50 MG tablet Take by mouth.     No current facility-administered medications on file prior to visit.    Allergies  Allergen Reactions   Flexeril [Cyclobenzaprine] Anaphylaxis   Lactose Intolerance (Gi)     Dairy-makes him wheeze   Lisinopril Cough   Wheat     Makes him wheeze     DIAGNOSTIC DATA (LABS, IMAGING, TESTING) - I reviewed patient records, labs, notes, testing and imaging myself where available.  Lab Results  Component Value Date   WBC 17.0 (H) 06/02/2019   HGB 16.4 06/02/2019   HCT 49.2 06/02/2019   MCV 97.8 06/02/2019   PLT 210 06/02/2019      Component Value Date/Time   NA 131 (L) 06/02/2019 0351   K 4.3 06/02/2019 0351   CL 101 06/02/2019 0351   CO2 23 06/02/2019 0351   GLUCOSE 157 (H) 06/02/2019 0351   BUN 21 (H) 06/02/2019 0351   CREATININE 1.25 (H) 06/02/2019 0351   CALCIUM 7.6 (L) 06/02/2019 0351   PROT 7.5 05/28/2019 0900   ALBUMIN 4.4 05/28/2019 0900   AST 33 05/28/2019 0900   ALT 39 05/28/2019 0900   ALKPHOS 54 05/28/2019 0900   BILITOT 1.0 05/28/2019 0900   GFRNONAA >60 06/02/2019 0351   GFRAA >60 06/02/2019 0351   No results found for: "CHOL", "HDL", "LDLCALC", "LDLDIRECT", "TRIG", "CHOLHDL" No results found for: "HGBA1C" No results found for: "VITAMINB12" No results  found for: "TSH"  PHYSICAL EXAM:  Today's Vitals   04/11/23 1339  BP: 131/83  Pulse: 70  Weight: 215 lb (97.5 kg)  Height: 5\' 9"  (1.753 m)   Body mass index is 31.75 kg/m.   Wt Readings from Last 3 Encounters:  04/11/23 215 lb (97.5 kg)  11/05/19 220 lb (99.8 kg)  06/01/19 221 lb (100.2 kg)     Ht Readings from Last 3 Encounters:  04/11/23 5\' 9"  (1.753 m)  11/05/19 5\' 9"  (1.753 m)  06/01/19 5\' 9"  (1.753 m)      General: The patient is awake, alert and appears not in acute distress. The patient is well groomed. Head: Normocephalic, atraumatic. Neck is supple. Mallampati 3, small mouth.  neck circumference:18 inches . Nasal airflow  patent.  Retrognathia is not seen.  Dental status: biological teeth.  Cardiovascular:  Regular rate and cardiac rhythm by pulse,  without distended neck veins. Respiratory: Lungs are clear to auscultation.  Skin:   flushed, almost pink, Trunk: The patient's posture is erect.   Neurologic exam : The patient is awake and alert, oriented to place and time.   Memory subjective described as intact.  Attention span & concentration ability appears normal.  Speech is fluent,  without  dysarthria,  He has clearly dysphonia .  Mood and affect are anxious, appropriate.   Cranial nerves: no loss of smell or taste reported  Pupils are equal and briskly reactive to light. Funduscopic exam deferred.  Extraocular movements in vertical and horizontal planes were intact and without nystagmus.  No Diplopia. Visual fields by finger perimetry are intact. Hearing was intact to soft voice and finger rubbing.    Facial sensation intact to fine touch.  Facial motor strength is symmetric and tongue and uvula move midline.  Neck ROM : rotation, tilt and flexion extension were normal for age and shoulder shrug was symmetrical.    Motor exam:  Symmetric bulk, tone and ROM.   Normal tone without cog wheeling, symmetric grip strength .   Sensory:   normal.   Proprioception tested in the upper extremities was normal.   Coordination: Rapid alternating movements in the fingers/hands were of normal speed,  without evidence of ataxia, dysmetria or tremor.   Gait and station: Patient could rise unassisted from a seated position, walked without assistive device. He has had a recent knee replacement.  Toe and heel walk were deferred.  Deep tendon reflexes: in the  upper and lower extremities are symmetric and intact.  Babinski response was deferred.         ASSESSMENT AND PLAN 62 year old male patient here with concerns about persistent erythrocytosis, but he was released from hematology. (?). He suggested that the  origin was due to OSA with hypoxia, but there was no hypoxia and no testosterone use for decade or longer.  I am ordering a NEW ONO on CPAP through his DME, he will continue the CPAP compliantly.  And I will recommend a phlebotomy if Hgb/Hct will rise again.  He is scheduled to see FP in [facility] for follow up in September     1) ONO ordered, supplies orders, RV in 10 months, nasal mask Eson .    I plan to follow up either personally or through our NP within 10 months.   I would like to thank [facility] and [place], [facility] [address] [place],  [place] [address] for allowing me to meet with and to take care of this pleasant patient.   CC: After spending a total time of  35  minutes face to face and additional time for physical and neurologic examination, review of laboratory studies,  personal review of imaging studies, reports and results of other testing and review of referral information / records as far as provided in visit,   Electronically signed by: Melvyn Novas, MD 04/11/2023 2:06 PM  [facility] and [facility] Sleep Board certified by The [employer] of Sleep Medicine and Diplomate of the [employer] of Sleep Medicine. Board certified In Neurology through the ABPN, Fellow of the [employer] of Neurology.

## 2023-04-12 DIAGNOSIS — G4733 Obstructive sleep apnea (adult) (pediatric): Secondary | ICD-10-CM | POA: Diagnosis not present

## 2023-04-16 DIAGNOSIS — M25521 Pain in right elbow: Secondary | ICD-10-CM | POA: Diagnosis not present

## 2023-04-17 DIAGNOSIS — M79602 Pain in left arm: Secondary | ICD-10-CM | POA: Diagnosis not present

## 2023-04-25 DIAGNOSIS — M79602 Pain in left arm: Secondary | ICD-10-CM | POA: Diagnosis not present

## 2023-05-13 DIAGNOSIS — G4733 Obstructive sleep apnea (adult) (pediatric): Secondary | ICD-10-CM | POA: Diagnosis not present

## 2023-05-16 DIAGNOSIS — G8918 Other acute postprocedural pain: Secondary | ICD-10-CM | POA: Diagnosis not present

## 2023-05-16 DIAGNOSIS — X58XXXA Exposure to other specified factors, initial encounter: Secondary | ICD-10-CM | POA: Diagnosis not present

## 2023-05-16 DIAGNOSIS — Y999 Unspecified external cause status: Secondary | ICD-10-CM | POA: Diagnosis not present

## 2023-05-16 DIAGNOSIS — M7022 Olecranon bursitis, left elbow: Secondary | ICD-10-CM | POA: Diagnosis not present

## 2023-05-16 DIAGNOSIS — S46312A Strain of muscle, fascia and tendon of triceps, left arm, initial encounter: Secondary | ICD-10-CM | POA: Diagnosis not present

## 2023-05-24 DIAGNOSIS — M79602 Pain in left arm: Secondary | ICD-10-CM | POA: Diagnosis not present

## 2023-05-30 ENCOUNTER — Telehealth: Payer: Self-pay | Admitting: Neurology

## 2023-05-30 NOTE — Telephone Encounter (Signed)
Pt has called to report that he has yet to receive the Device to measure Oxygen levels, please call pt to discuss.

## 2023-05-30 NOTE — Telephone Encounter (Signed)
Will send a message to [employer] and have them follow up. Order was sent 04/11/23. Sent the message to the management team to follow up. Pt can also call [employer] [phone] to have them schedule this.

## 2023-06-03 NOTE — Telephone Encounter (Signed)
Called and Left detailed information on VM. Ok per [employer].

## 2023-06-05 DIAGNOSIS — M1712 Unilateral primary osteoarthritis, left knee: Secondary | ICD-10-CM | POA: Diagnosis not present

## 2023-06-12 DIAGNOSIS — G4733 Obstructive sleep apnea (adult) (pediatric): Secondary | ICD-10-CM | POA: Diagnosis not present

## 2023-07-12 DIAGNOSIS — G4733 Obstructive sleep apnea (adult) (pediatric): Secondary | ICD-10-CM | POA: Diagnosis not present

## 2023-08-04 DIAGNOSIS — G4730 Sleep apnea, unspecified: Secondary | ICD-10-CM | POA: Diagnosis not present

## 2023-08-04 DIAGNOSIS — R0902 Hypoxemia: Secondary | ICD-10-CM | POA: Diagnosis not present

## 2023-08-06 DIAGNOSIS — F411 Generalized anxiety disorder: Secondary | ICD-10-CM | POA: Diagnosis not present

## 2023-08-06 DIAGNOSIS — F410 Panic disorder [episodic paroxysmal anxiety] without agoraphobia: Secondary | ICD-10-CM | POA: Diagnosis not present

## 2023-08-06 DIAGNOSIS — F902 Attention-deficit hyperactivity disorder, combined type: Secondary | ICD-10-CM | POA: Diagnosis not present

## 2023-08-06 DIAGNOSIS — Z5181 Encounter for therapeutic drug level monitoring: Secondary | ICD-10-CM | POA: Diagnosis not present

## 2023-08-06 DIAGNOSIS — F1320 Sedative, hypnotic or anxiolytic dependence, uncomplicated: Secondary | ICD-10-CM | POA: Diagnosis not present

## 2023-08-06 DIAGNOSIS — Z79899 Other long term (current) drug therapy: Secondary | ICD-10-CM | POA: Diagnosis not present

## 2023-08-07 ENCOUNTER — Telehealth: Payer: Self-pay | Admitting: *Deleted

## 2023-08-07 NOTE — Telephone Encounter (Signed)
Results of ONO test . Results placed in Dr Vickey Huger box for review

## 2023-08-11 DIAGNOSIS — G4733 Obstructive sleep apnea (adult) (pediatric): Secondary | ICD-10-CM | POA: Diagnosis not present

## 2023-08-12 NOTE — Telephone Encounter (Signed)
Based off the ONO report while using CPAP. The recording was 5 hrs 56 min ad 28 sec. The lowest the oxygen level dropped was 91%. This means CPAP is helping to keep his oxygen in a good range and additional oxygen is not needed.

## 2023-08-12 NOTE — Telephone Encounter (Signed)
Called Patient and informed him of his ONO report while using CPAP. The recording was 5 hrs 56 min ad 28 sec. The lowest the oxygen level dropped was 91%. This means CPAP is helping to keep his oxygen in a good range and additional oxygen is not needed. Pt verbalized understanding. Pt had no questions at this time but was encouraged to call back if questions arise.

## 2023-08-30 DIAGNOSIS — M25522 Pain in left elbow: Secondary | ICD-10-CM | POA: Diagnosis not present

## 2023-09-04 ENCOUNTER — Telehealth: Payer: Self-pay

## 2023-09-04 ENCOUNTER — Encounter: Payer: Self-pay | Admitting: Neurology

## 2023-09-04 NOTE — Telephone Encounter (Signed)
 I attempted to call the patient but was unable to leave a voicemail. I was unsure if someone picked up, road noise was present. There was no reply to "hello".

## 2023-09-04 NOTE — Telephone Encounter (Signed)
-----   Message from Lima Dohmeier sent at 09/04/2023 12:37 PM EST ----- No need for oxygen supplementation,  patients ONO showed minimal hypoxia.   CC [facility] ,please

## 2023-09-05 NOTE — Telephone Encounter (Signed)
LVM for pt to call back to discuss test results.

## 2023-09-11 DIAGNOSIS — G4733 Obstructive sleep apnea (adult) (pediatric): Secondary | ICD-10-CM | POA: Diagnosis not present

## 2023-10-22 DIAGNOSIS — D582 Other hemoglobinopathies: Secondary | ICD-10-CM | POA: Diagnosis not present

## 2023-10-22 DIAGNOSIS — R5383 Other fatigue: Secondary | ICD-10-CM | POA: Diagnosis not present

## 2023-11-12 DIAGNOSIS — G4733 Obstructive sleep apnea (adult) (pediatric): Secondary | ICD-10-CM | POA: Diagnosis not present

## 2023-11-12 DIAGNOSIS — D751 Secondary polycythemia: Secondary | ICD-10-CM | POA: Diagnosis not present

## 2023-11-12 DIAGNOSIS — R5383 Other fatigue: Secondary | ICD-10-CM | POA: Diagnosis not present

## 2023-11-12 DIAGNOSIS — Z8619 Personal history of other infectious and parasitic diseases: Secondary | ICD-10-CM | POA: Diagnosis not present

## 2023-11-14 DIAGNOSIS — R5383 Other fatigue: Secondary | ICD-10-CM | POA: Diagnosis not present

## 2023-11-14 DIAGNOSIS — D751 Secondary polycythemia: Secondary | ICD-10-CM | POA: Diagnosis not present

## 2023-11-14 DIAGNOSIS — Z8619 Personal history of other infectious and parasitic diseases: Secondary | ICD-10-CM | POA: Diagnosis not present

## 2023-11-14 DIAGNOSIS — G4733 Obstructive sleep apnea (adult) (pediatric): Secondary | ICD-10-CM | POA: Diagnosis not present

## 2023-11-20 DIAGNOSIS — G4733 Obstructive sleep apnea (adult) (pediatric): Secondary | ICD-10-CM | POA: Diagnosis not present

## 2023-11-20 DIAGNOSIS — D751 Secondary polycythemia: Secondary | ICD-10-CM | POA: Diagnosis not present

## 2023-11-21 DIAGNOSIS — D751 Secondary polycythemia: Secondary | ICD-10-CM | POA: Diagnosis not present

## 2023-11-21 DIAGNOSIS — R5383 Other fatigue: Secondary | ICD-10-CM | POA: Diagnosis not present

## 2023-11-21 DIAGNOSIS — Z8619 Personal history of other infectious and parasitic diseases: Secondary | ICD-10-CM | POA: Diagnosis not present

## 2023-11-21 DIAGNOSIS — G4733 Obstructive sleep apnea (adult) (pediatric): Secondary | ICD-10-CM | POA: Diagnosis not present

## 2023-11-29 DIAGNOSIS — D751 Secondary polycythemia: Secondary | ICD-10-CM | POA: Diagnosis not present

## 2023-12-05 DIAGNOSIS — M25561 Pain in right knee: Secondary | ICD-10-CM | POA: Diagnosis not present

## 2023-12-12 DIAGNOSIS — M7021 Olecranon bursitis, right elbow: Secondary | ICD-10-CM | POA: Diagnosis not present

## 2023-12-18 DIAGNOSIS — E7800 Pure hypercholesterolemia, unspecified: Secondary | ICD-10-CM | POA: Diagnosis not present

## 2023-12-18 DIAGNOSIS — R5383 Other fatigue: Secondary | ICD-10-CM | POA: Diagnosis not present

## 2023-12-18 DIAGNOSIS — Z79899 Other long term (current) drug therapy: Secondary | ICD-10-CM | POA: Diagnosis not present

## 2023-12-26 DIAGNOSIS — D751 Secondary polycythemia: Secondary | ICD-10-CM | POA: Diagnosis not present

## 2023-12-31 DIAGNOSIS — D751 Secondary polycythemia: Secondary | ICD-10-CM | POA: Diagnosis not present

## 2023-12-31 DIAGNOSIS — G4733 Obstructive sleep apnea (adult) (pediatric): Secondary | ICD-10-CM | POA: Diagnosis not present

## 2023-12-31 DIAGNOSIS — Z8619 Personal history of other infectious and parasitic diseases: Secondary | ICD-10-CM | POA: Diagnosis not present

## 2024-01-08 DIAGNOSIS — S46311A Strain of muscle, fascia and tendon of triceps, right arm, initial encounter: Secondary | ICD-10-CM | POA: Diagnosis not present

## 2024-01-08 DIAGNOSIS — M7021 Olecranon bursitis, right elbow: Secondary | ICD-10-CM | POA: Diagnosis not present

## 2024-01-14 DIAGNOSIS — S46311A Strain of muscle, fascia and tendon of triceps, right arm, initial encounter: Secondary | ICD-10-CM | POA: Diagnosis not present

## 2024-01-14 DIAGNOSIS — Y999 Unspecified external cause status: Secondary | ICD-10-CM | POA: Diagnosis not present

## 2024-01-14 DIAGNOSIS — G8918 Other acute postprocedural pain: Secondary | ICD-10-CM | POA: Diagnosis not present

## 2024-01-14 DIAGNOSIS — G4733 Obstructive sleep apnea (adult) (pediatric): Secondary | ICD-10-CM | POA: Diagnosis not present

## 2024-01-14 DIAGNOSIS — X58XXXA Exposure to other specified factors, initial encounter: Secondary | ICD-10-CM | POA: Diagnosis not present

## 2024-01-16 DIAGNOSIS — M1712 Unilateral primary osteoarthritis, left knee: Secondary | ICD-10-CM | POA: Diagnosis not present

## 2024-01-23 DIAGNOSIS — M1712 Unilateral primary osteoarthritis, left knee: Secondary | ICD-10-CM | POA: Diagnosis not present

## 2024-01-24 DIAGNOSIS — S46311D Strain of muscle, fascia and tendon of triceps, right arm, subsequent encounter: Secondary | ICD-10-CM | POA: Diagnosis not present

## 2024-01-30 DIAGNOSIS — M1712 Unilateral primary osteoarthritis, left knee: Secondary | ICD-10-CM | POA: Diagnosis not present

## 2024-02-04 DIAGNOSIS — F1320 Sedative, hypnotic or anxiolytic dependence, uncomplicated: Secondary | ICD-10-CM | POA: Diagnosis not present

## 2024-02-04 DIAGNOSIS — F902 Attention-deficit hyperactivity disorder, combined type: Secondary | ICD-10-CM | POA: Diagnosis not present

## 2024-02-04 DIAGNOSIS — Z79899 Other long term (current) drug therapy: Secondary | ICD-10-CM | POA: Diagnosis not present

## 2024-02-04 DIAGNOSIS — F410 Panic disorder [episodic paroxysmal anxiety] without agoraphobia: Secondary | ICD-10-CM | POA: Diagnosis not present

## 2024-02-04 DIAGNOSIS — F411 Generalized anxiety disorder: Secondary | ICD-10-CM | POA: Diagnosis not present

## 2024-02-04 DIAGNOSIS — Z5181 Encounter for therapeutic drug level monitoring: Secondary | ICD-10-CM | POA: Diagnosis not present

## 2024-02-05 DIAGNOSIS — D751 Secondary polycythemia: Secondary | ICD-10-CM | POA: Diagnosis not present

## 2024-02-11 DIAGNOSIS — D751 Secondary polycythemia: Secondary | ICD-10-CM | POA: Diagnosis not present

## 2024-02-11 DIAGNOSIS — R5383 Other fatigue: Secondary | ICD-10-CM | POA: Diagnosis not present

## 2024-03-10 DIAGNOSIS — E291 Testicular hypofunction: Secondary | ICD-10-CM | POA: Diagnosis not present

## 2024-03-10 DIAGNOSIS — Z6832 Body mass index (BMI) 32.0-32.9, adult: Secondary | ICD-10-CM | POA: Diagnosis not present

## 2024-03-10 DIAGNOSIS — D751 Secondary polycythemia: Secondary | ICD-10-CM | POA: Diagnosis not present

## 2024-03-10 DIAGNOSIS — M2550 Pain in unspecified joint: Secondary | ICD-10-CM | POA: Diagnosis not present

## 2024-03-12 DIAGNOSIS — D751 Secondary polycythemia: Secondary | ICD-10-CM | POA: Diagnosis not present

## 2024-04-07 DIAGNOSIS — Z79890 Hormone replacement therapy (postmenopausal): Secondary | ICD-10-CM | POA: Diagnosis not present

## 2024-04-07 DIAGNOSIS — E291 Testicular hypofunction: Secondary | ICD-10-CM | POA: Diagnosis not present

## 2024-04-09 DIAGNOSIS — E291 Testicular hypofunction: Secondary | ICD-10-CM | POA: Diagnosis not present

## 2024-04-09 DIAGNOSIS — Z6832 Body mass index (BMI) 32.0-32.9, adult: Secondary | ICD-10-CM | POA: Diagnosis not present

## 2024-04-09 DIAGNOSIS — D751 Secondary polycythemia: Secondary | ICD-10-CM | POA: Diagnosis not present

## 2024-04-09 DIAGNOSIS — G479 Sleep disorder, unspecified: Secondary | ICD-10-CM | POA: Diagnosis not present

## 2024-04-09 DIAGNOSIS — M2550 Pain in unspecified joint: Secondary | ICD-10-CM | POA: Diagnosis not present

## 2024-04-15 DIAGNOSIS — D751 Secondary polycythemia: Secondary | ICD-10-CM | POA: Diagnosis not present

## 2024-05-07 DIAGNOSIS — D751 Secondary polycythemia: Secondary | ICD-10-CM | POA: Diagnosis not present

## 2024-05-08 DIAGNOSIS — Z8619 Personal history of other infectious and parasitic diseases: Secondary | ICD-10-CM | POA: Diagnosis not present

## 2024-05-08 DIAGNOSIS — G4733 Obstructive sleep apnea (adult) (pediatric): Secondary | ICD-10-CM | POA: Diagnosis not present

## 2024-05-08 DIAGNOSIS — D751 Secondary polycythemia: Secondary | ICD-10-CM | POA: Diagnosis not present

## 2024-06-12 DIAGNOSIS — D751 Secondary polycythemia: Secondary | ICD-10-CM | POA: Diagnosis not present

## 2024-06-18 DIAGNOSIS — F902 Attention-deficit hyperactivity disorder, combined type: Secondary | ICD-10-CM | POA: Diagnosis not present

## 2024-06-18 DIAGNOSIS — F410 Panic disorder [episodic paroxysmal anxiety] without agoraphobia: Secondary | ICD-10-CM | POA: Diagnosis not present

## 2024-06-18 DIAGNOSIS — F411 Generalized anxiety disorder: Secondary | ICD-10-CM | POA: Diagnosis not present

## 2024-06-22 DIAGNOSIS — M1712 Unilateral primary osteoarthritis, left knee: Secondary | ICD-10-CM | POA: Diagnosis not present

## 2024-06-22 DIAGNOSIS — G8929 Other chronic pain: Secondary | ICD-10-CM | POA: Diagnosis not present

## 2024-06-22 DIAGNOSIS — M170 Bilateral primary osteoarthritis of knee: Secondary | ICD-10-CM | POA: Diagnosis not present

## 2024-06-22 DIAGNOSIS — M25562 Pain in left knee: Secondary | ICD-10-CM | POA: Diagnosis not present

## 2024-07-09 DIAGNOSIS — M2550 Pain in unspecified joint: Secondary | ICD-10-CM | POA: Diagnosis not present

## 2024-07-09 DIAGNOSIS — G479 Sleep disorder, unspecified: Secondary | ICD-10-CM | POA: Diagnosis not present

## 2024-07-09 DIAGNOSIS — Z6834 Body mass index (BMI) 34.0-34.9, adult: Secondary | ICD-10-CM | POA: Diagnosis not present

## 2024-07-12 DIAGNOSIS — G4733 Obstructive sleep apnea (adult) (pediatric): Secondary | ICD-10-CM | POA: Diagnosis not present

## 2024-08-10 DIAGNOSIS — M1712 Unilateral primary osteoarthritis, left knee: Secondary | ICD-10-CM | POA: Diagnosis not present
